# Patient Record
Sex: Female | Born: 1980 | Race: White | Hispanic: No | Marital: Married | Smoking: Former smoker
Health system: Southern US, Community
[De-identification: ages and names within clinical notes are randomized; demographics above are authoritative.]

## PROBLEM LIST (undated history)

## (undated) HISTORY — PX: LEEP: SHX91

---

## 2002-01-25 ENCOUNTER — Ambulatory Visit (HOSPITAL_COMMUNITY): Admission: RE | Admit: 2002-01-25 | Discharge: 2002-01-25 | Payer: Self-pay | Admitting: *Deleted

## 2002-02-13 ENCOUNTER — Inpatient Hospital Stay (HOSPITAL_COMMUNITY): Admission: AD | Admit: 2002-02-13 | Discharge: 2002-02-15 | Payer: Self-pay | Admitting: *Deleted

## 2002-02-19 ENCOUNTER — Inpatient Hospital Stay (HOSPITAL_COMMUNITY): Admission: AD | Admit: 2002-02-19 | Discharge: 2002-02-19 | Payer: Self-pay | Admitting: Obstetrics and Gynecology

## 2002-03-01 ENCOUNTER — Encounter: Admission: RE | Admit: 2002-03-01 | Discharge: 2002-03-01 | Payer: Self-pay | Admitting: *Deleted

## 2002-03-15 ENCOUNTER — Encounter: Admission: RE | Admit: 2002-03-15 | Discharge: 2002-03-15 | Payer: Self-pay | Admitting: *Deleted

## 2002-03-19 ENCOUNTER — Inpatient Hospital Stay (HOSPITAL_COMMUNITY): Admission: AD | Admit: 2002-03-19 | Discharge: 2002-03-19 | Payer: Self-pay | Admitting: Obstetrics and Gynecology

## 2002-03-27 ENCOUNTER — Inpatient Hospital Stay (HOSPITAL_COMMUNITY): Admission: AD | Admit: 2002-03-27 | Discharge: 2002-03-29 | Payer: Self-pay | Admitting: *Deleted

## 2002-04-05 ENCOUNTER — Encounter: Admission: RE | Admit: 2002-04-05 | Discharge: 2002-04-05 | Payer: Self-pay | Admitting: *Deleted

## 2002-04-05 ENCOUNTER — Encounter (HOSPITAL_COMMUNITY): Admission: AD | Admit: 2002-04-05 | Discharge: 2002-05-05 | Payer: Self-pay | Admitting: Obstetrics and Gynecology

## 2002-04-06 ENCOUNTER — Ambulatory Visit (HOSPITAL_COMMUNITY): Admission: RE | Admit: 2002-04-06 | Discharge: 2002-04-06 | Payer: Self-pay | Admitting: *Deleted

## 2002-04-12 ENCOUNTER — Encounter: Admission: RE | Admit: 2002-04-12 | Discharge: 2002-04-12 | Payer: Self-pay | Admitting: *Deleted

## 2002-04-19 ENCOUNTER — Encounter: Admission: RE | Admit: 2002-04-19 | Discharge: 2002-04-19 | Payer: Self-pay | Admitting: *Deleted

## 2002-04-26 ENCOUNTER — Encounter: Admission: RE | Admit: 2002-04-26 | Discharge: 2002-04-26 | Payer: Self-pay | Admitting: *Deleted

## 2002-05-03 ENCOUNTER — Encounter: Admission: RE | Admit: 2002-05-03 | Discharge: 2002-05-03 | Payer: Self-pay | Admitting: Obstetrics and Gynecology

## 2002-05-04 ENCOUNTER — Inpatient Hospital Stay (HOSPITAL_COMMUNITY): Admission: AD | Admit: 2002-05-04 | Discharge: 2002-05-04 | Payer: Self-pay | Admitting: *Deleted

## 2002-05-10 ENCOUNTER — Encounter: Admission: RE | Admit: 2002-05-10 | Discharge: 2002-05-10 | Payer: Self-pay | Admitting: *Deleted

## 2002-05-17 ENCOUNTER — Encounter: Admission: RE | Admit: 2002-05-17 | Discharge: 2002-05-17 | Payer: Self-pay | Admitting: *Deleted

## 2002-05-31 ENCOUNTER — Encounter: Admission: RE | Admit: 2002-05-31 | Discharge: 2002-05-31 | Payer: Self-pay | Admitting: *Deleted

## 2002-05-31 ENCOUNTER — Encounter (HOSPITAL_COMMUNITY): Admission: RE | Admit: 2002-05-31 | Discharge: 2002-06-30 | Payer: Self-pay | Admitting: *Deleted

## 2002-06-07 ENCOUNTER — Encounter: Admission: RE | Admit: 2002-06-07 | Discharge: 2002-06-07 | Payer: Self-pay | Admitting: *Deleted

## 2002-06-14 ENCOUNTER — Encounter: Admission: RE | Admit: 2002-06-14 | Discharge: 2002-06-14 | Payer: Self-pay | Admitting: *Deleted

## 2002-06-21 ENCOUNTER — Encounter: Admission: RE | Admit: 2002-06-21 | Discharge: 2002-06-21 | Payer: Self-pay | Admitting: *Deleted

## 2002-06-25 ENCOUNTER — Inpatient Hospital Stay (HOSPITAL_COMMUNITY): Admission: AD | Admit: 2002-06-25 | Discharge: 2002-06-25 | Payer: Self-pay | Admitting: Obstetrics and Gynecology

## 2002-06-27 ENCOUNTER — Encounter: Admission: RE | Admit: 2002-06-27 | Discharge: 2002-06-27 | Payer: Self-pay | Admitting: *Deleted

## 2002-06-30 ENCOUNTER — Inpatient Hospital Stay (HOSPITAL_COMMUNITY): Admission: AD | Admit: 2002-06-30 | Discharge: 2002-06-30 | Payer: Self-pay | Admitting: *Deleted

## 2002-07-01 ENCOUNTER — Inpatient Hospital Stay (HOSPITAL_COMMUNITY): Admission: AD | Admit: 2002-07-01 | Discharge: 2002-07-03 | Payer: Self-pay | Admitting: *Deleted

## 2004-12-29 ENCOUNTER — Ambulatory Visit: Payer: Self-pay | Admitting: *Deleted

## 2005-01-04 ENCOUNTER — Ambulatory Visit: Payer: Self-pay | Admitting: *Deleted

## 2005-01-04 ENCOUNTER — Inpatient Hospital Stay (HOSPITAL_COMMUNITY): Admission: AD | Admit: 2005-01-04 | Discharge: 2005-01-06 | Payer: Self-pay | Admitting: *Deleted

## 2005-01-13 ENCOUNTER — Ambulatory Visit: Payer: Self-pay | Admitting: *Deleted

## 2005-02-03 ENCOUNTER — Ambulatory Visit: Payer: Self-pay | Admitting: Family Medicine

## 2005-02-17 ENCOUNTER — Ambulatory Visit: Payer: Self-pay | Admitting: *Deleted

## 2005-02-17 ENCOUNTER — Ambulatory Visit (HOSPITAL_COMMUNITY): Admission: RE | Admit: 2005-02-17 | Discharge: 2005-02-17 | Payer: Self-pay | Admitting: Unknown Physician Specialty

## 2005-03-03 ENCOUNTER — Ambulatory Visit: Payer: Self-pay | Admitting: Family Medicine

## 2005-03-24 ENCOUNTER — Ambulatory Visit: Payer: Self-pay | Admitting: Obstetrics & Gynecology

## 2005-03-31 ENCOUNTER — Ambulatory Visit: Payer: Self-pay | Admitting: Obstetrics & Gynecology

## 2005-04-01 ENCOUNTER — Inpatient Hospital Stay (HOSPITAL_COMMUNITY): Admission: AD | Admit: 2005-04-01 | Discharge: 2005-04-01 | Payer: Self-pay | Admitting: *Deleted

## 2005-04-07 ENCOUNTER — Ambulatory Visit: Payer: Self-pay | Admitting: *Deleted

## 2005-04-14 ENCOUNTER — Ambulatory Visit: Payer: Self-pay | Admitting: *Deleted

## 2005-04-28 ENCOUNTER — Ambulatory Visit: Payer: Self-pay | Admitting: Obstetrics & Gynecology

## 2005-05-05 ENCOUNTER — Ambulatory Visit: Payer: Self-pay | Admitting: *Deleted

## 2005-05-19 ENCOUNTER — Ambulatory Visit: Payer: Self-pay | Admitting: *Deleted

## 2005-05-26 ENCOUNTER — Ambulatory Visit: Payer: Self-pay | Admitting: *Deleted

## 2005-06-03 ENCOUNTER — Ambulatory Visit: Payer: Self-pay | Admitting: Family Medicine

## 2005-06-10 ENCOUNTER — Ambulatory Visit: Payer: Self-pay | Admitting: *Deleted

## 2005-06-10 ENCOUNTER — Inpatient Hospital Stay (HOSPITAL_COMMUNITY): Admission: AD | Admit: 2005-06-10 | Discharge: 2005-06-10 | Payer: Self-pay | Admitting: Family Medicine

## 2005-06-10 ENCOUNTER — Ambulatory Visit: Payer: Self-pay | Admitting: Family Medicine

## 2005-06-17 ENCOUNTER — Ambulatory Visit: Payer: Self-pay | Admitting: Family Medicine

## 2005-06-24 ENCOUNTER — Ambulatory Visit: Payer: Self-pay | Admitting: Family Medicine

## 2005-07-01 ENCOUNTER — Ambulatory Visit: Payer: Self-pay | Admitting: Family Medicine

## 2005-07-08 ENCOUNTER — Ambulatory Visit: Payer: Self-pay | Admitting: *Deleted

## 2005-07-08 ENCOUNTER — Inpatient Hospital Stay (HOSPITAL_COMMUNITY): Admission: AD | Admit: 2005-07-08 | Discharge: 2005-07-11 | Payer: Self-pay | Admitting: Obstetrics & Gynecology

## 2005-07-08 ENCOUNTER — Ambulatory Visit: Payer: Self-pay | Admitting: Obstetrics & Gynecology

## 2008-12-06 ENCOUNTER — Encounter: Admission: RE | Admit: 2008-12-06 | Discharge: 2008-12-06 | Payer: Self-pay | Admitting: Internal Medicine

## 2010-02-03 IMAGING — US US TRANSVAGINAL NON-OB
1 series · 14 of 25 positions shown · non-contrast
Comparison: None

CLINICAL DATA: Fibroids.

TRANSABDOMINAL AND TRANSVAGINAL ULTRASOUND OF PELVIS
TECHNIQUE: Both transabdominal and transvaginal ultrasound
examinations of the pelvis were performed including evaluation of
the uterus, ovaries, adnexal regions, and pelvic cul-de-sac.

[Series 1: us transvaginal non-ob · 0.33mm/px · 14 of 95 slices shown]
[im 1/95]
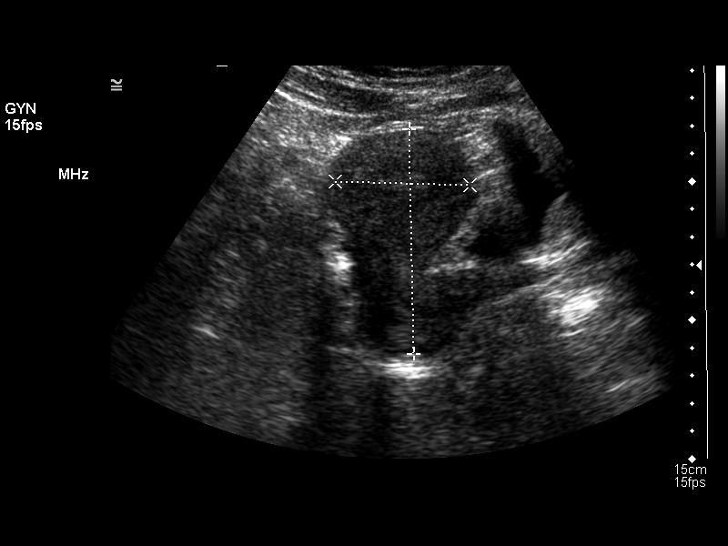
[im 8/95]
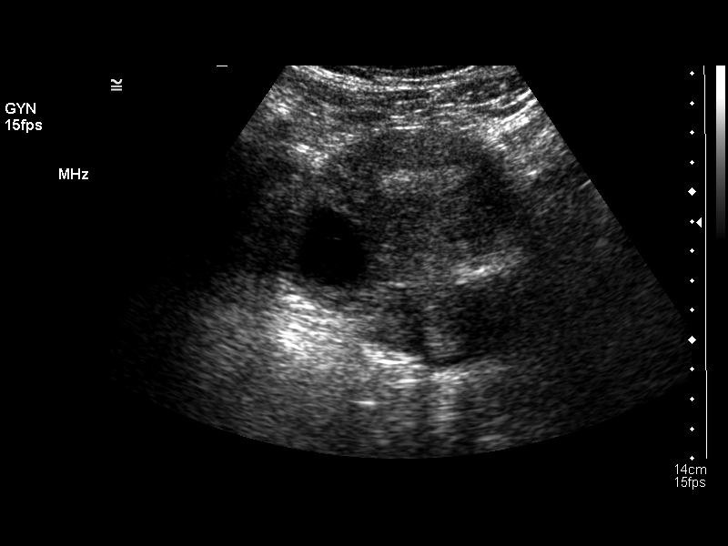
[im 16/95]
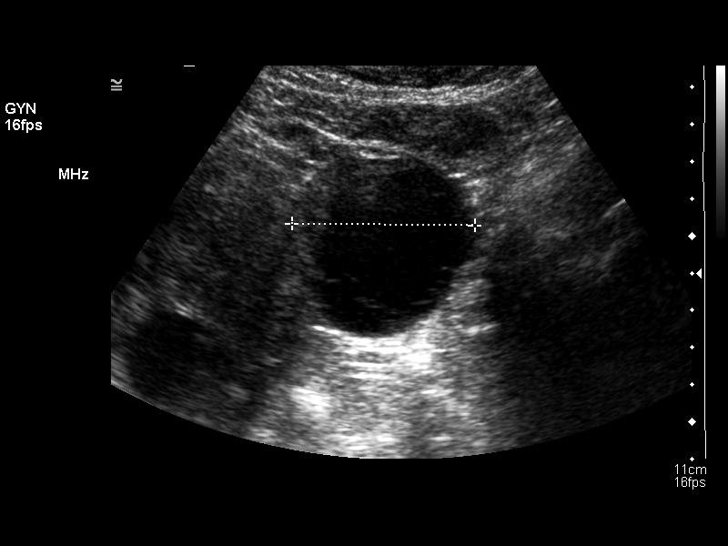
[im 24/95]
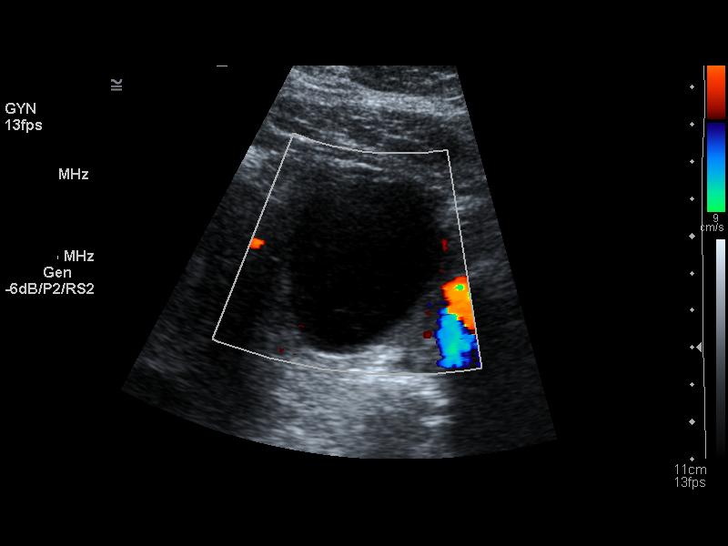
[im 32/95]
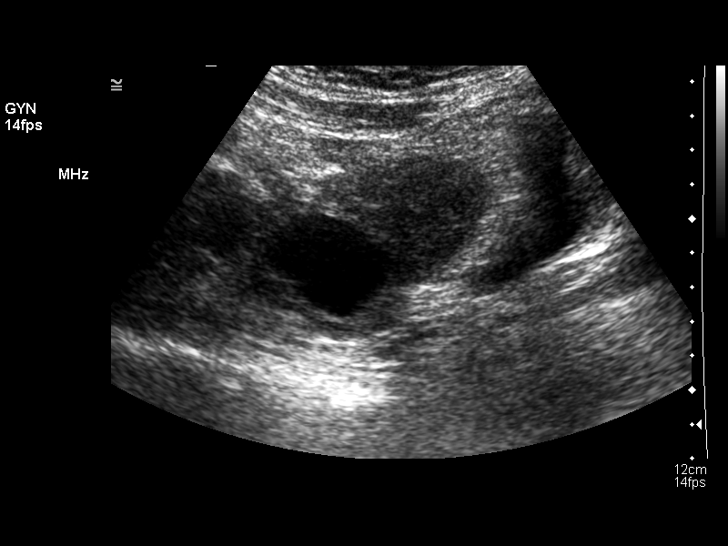
[im 36/95]
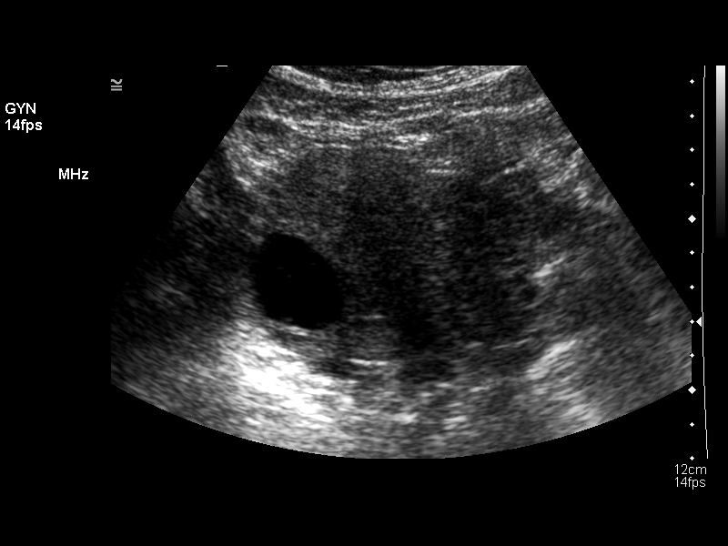
[im 44/95]
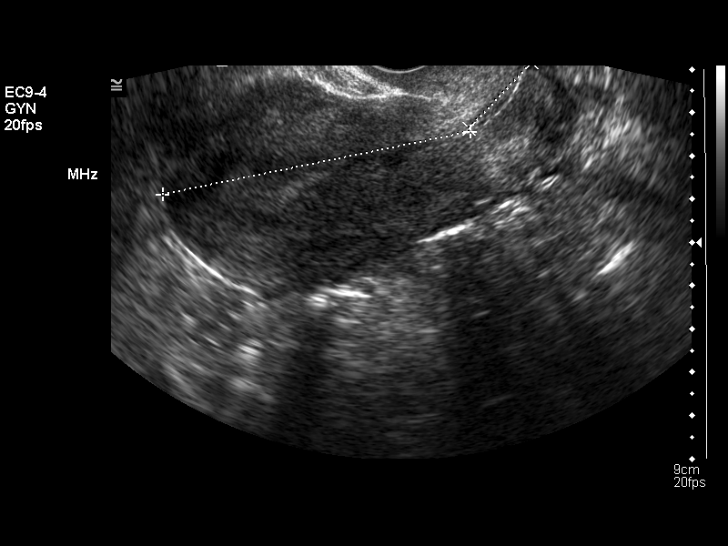
[im 51/95]
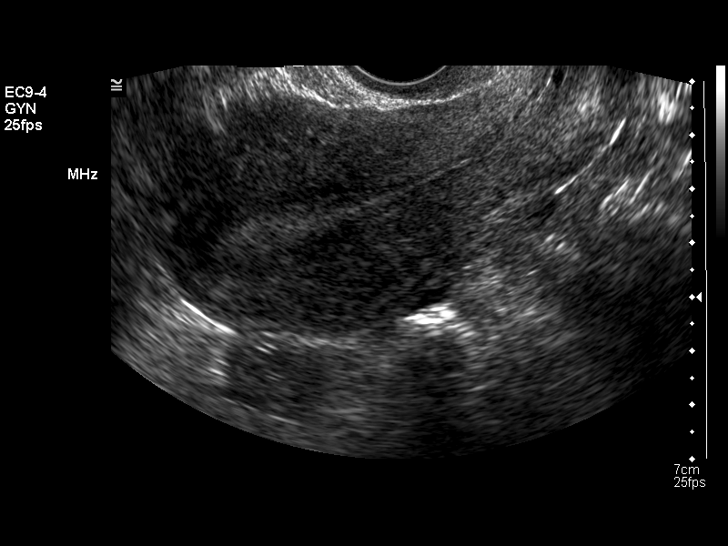
[im 59/95]
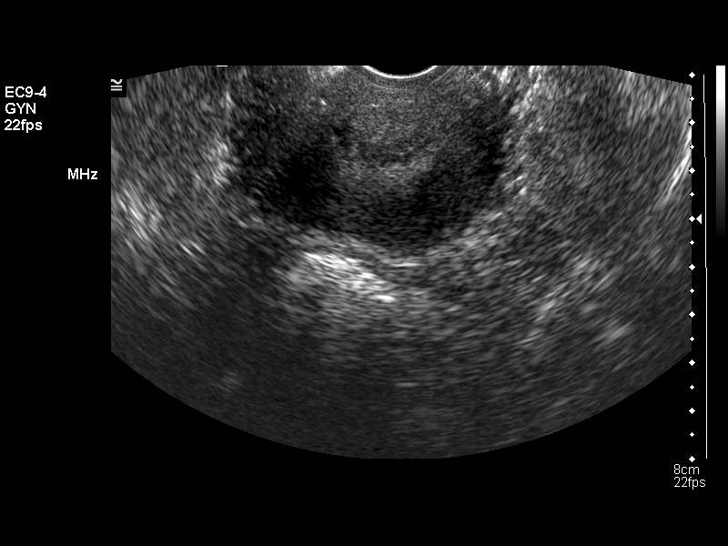
[im 63/95]
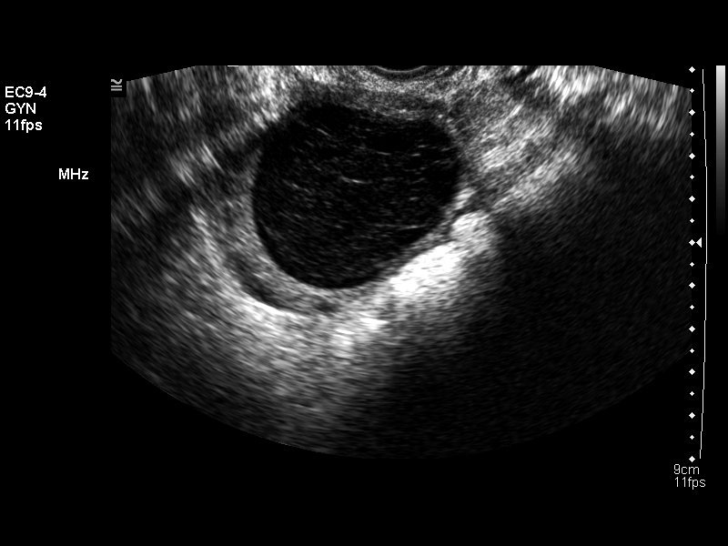
[im 71/95]
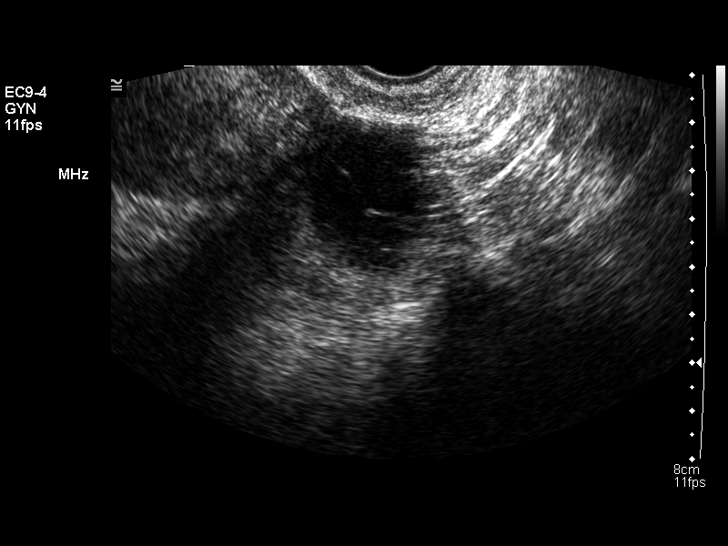
[im 79/95]
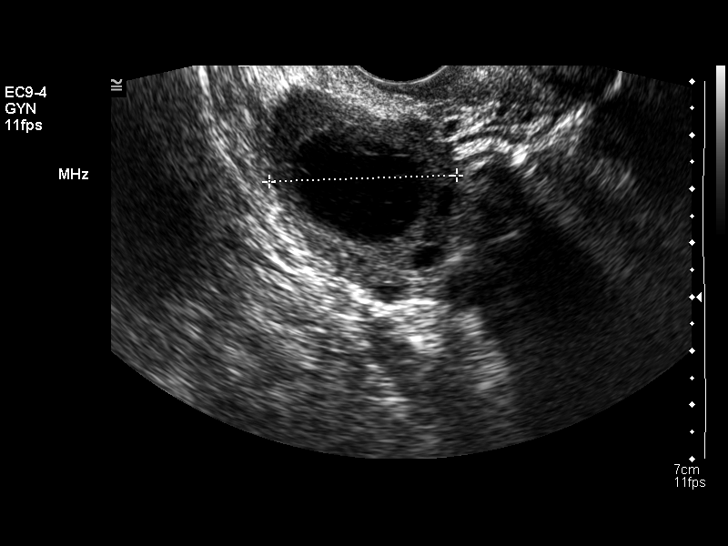
[im 87/95]
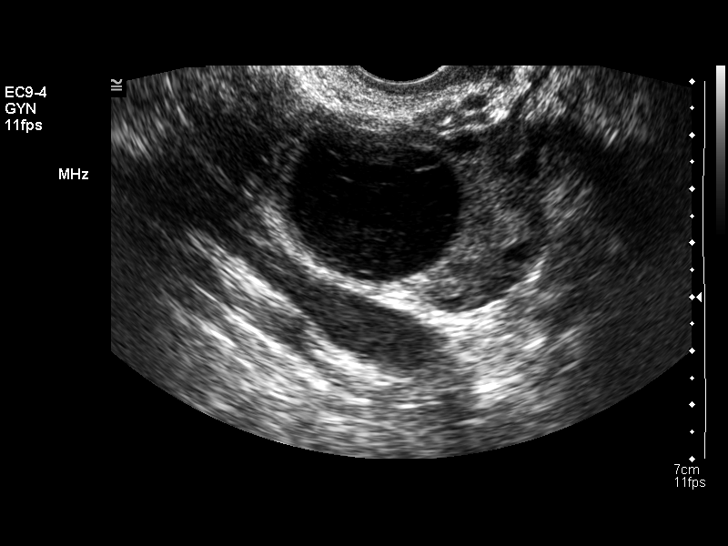
[im 95/95]
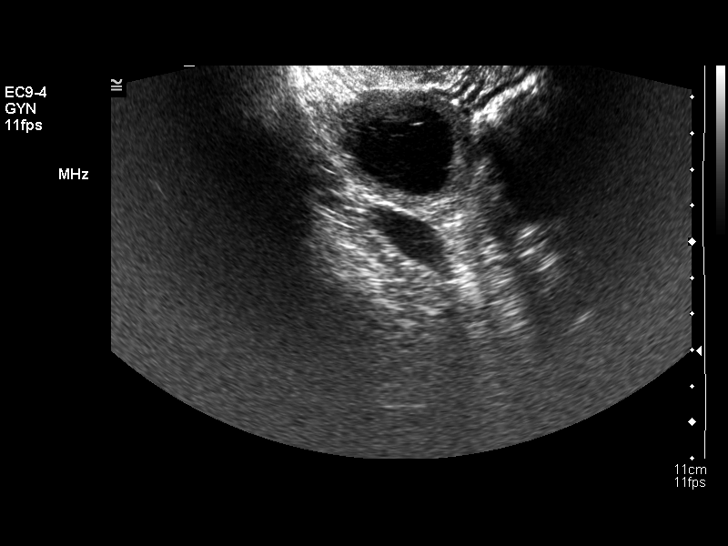

[14 of 25 positions shown; findings below may reference images not displayed]

FINDINGS: The uterus measures 5.4 cm in length and the fundus
measures 4.7 x 5.7 cm in transverse dimensions.  Endometrial
complex measures 9.5 mm in thickness.  The right ovary measures
x 3.4 x 3.5 cm.  The left ovary measures 6.0 x 4.5 x 5.7 cm.
Complex cyst in the right ovary measuring 3.2 x 2.8 x 3.0 cm.
There is a large complex cyst in the left ovary measuring 5.1 x
x 4.6 cm.  Both ovarian cyst and internal echoes, the left with a
lace-like echotexture pattern.  I believe that the patient has
hemorrhagic cysts.
IMPRESSION: Normal uterus.  No uterine fibroids.  Bilateral complex ovarian
cysts, compatible with hemorrhagic cysts.

## 2010-11-08 ENCOUNTER — Encounter: Payer: Self-pay | Admitting: Unknown Physician Specialty

## 2011-03-05 NOTE — Discharge Summary (Signed)
[facility]  Patient:    Toni Ortiz, Toni Ortiz Visit Number: [id number] MRN: [id number]          Service Type: OBS Location: 910D 9122 01 Attending Physician:  Michaelle Copas Dictated by:   Ed Blalock. Burnadette Peter, M.D. Admit Date:  03/27/2002 Discharge Date: 03/29/2002                             Discharge Summary  DATE OF BIRTH:                   03-13-1981  HISTORY OF PRESENT ILLNESS:      This 30 year old G2, P0-0-1-0, presented at 26-6/7 weeks based on a last menstrual period confirmed by 18-week ultrasound with a chief complaint of contractions and back pain.  The patient reported contractions since early the morning of admission and denied bleeding or leakage of fluid and reported positive fetal movement.  She reports lower abdominal pain and cramping back pain that had increased in frequency since the morning of admission, it started out every 15 minutes then every 5 minutes, it was worsened with activity.  She is seen at high risk clinic with onset of care at 23-plus weeks.  Prior to that she was seen at [facility] and had a rescue cerclage placed at 18 weeks.  MEDICATIONS:                     Prenatal vitamins.  ALLERGIES:                       No known drug allergies.  OBSTETRICAL HISTORY:             None.  GYNECOLOGICAL HISTORY:           She had a 9-week TAB in 2001 and a LEEP 2002.  MEDICAL HISTORY:                 History of UTIs.  SURGICAL HISTORY:                As above otherwise none.  FAMILY AND SOCIAL HISTORY:       Denied tobacco, illicit drug use.  OBJECTIVE:                       On presentation her vital signs were stable, she was afebrile, generally in no acute distress, heart rate regular rate and rhythm, lungs clear to auscultation, abdomen soft and nontender.  Digital cervical exam showed fingertip rescue cerclage intact.  Fetal heart rate was normal with good variability and reactive.  Cath UA on  presentation was negative.  The wet prep showed moderate clue cells with moderate bacteria.  HOSPITAL COURSE:                 This patient with a 26-6/7-weeks intrauterine pregnancy and cervicitis due to bacterial vaginosis was admitted and was started on Unasyn and Procardia.  On hospital day #1 she reported no contractions and no further cramping or any difficulty.  Cervical exam remained stable.  Hospital day #2 the patient again reported no abdominal pain, back pain, cramping and had a cervical exam as fingertip 2 to 2.5 cm, cerclage intact with no pressure on stitch and developed lower uterine segment which was unchanged from the day prior by my exam.  The patient also reported that she was supposed  to get a glucola and ultrasound for cervical length at 28 weeks.  On admission she had cultures done, GC and Chlamydia were negative, GBS was negative.  The patient deemed stable with negative cultures and was therefore discharged home with instructions for followup at high risk clinic.  DISCHARGE MEDICATION:            Flagyl 500 mg one p.o. b.i.d. x5 days.  DISCHARGE ACTIVITY:              Bed rest and pelvic rest.  DISCHARGE DIET:                  No restrictions.  DISCHARGE FOLLOWUP:              She is to be seen at high risk clinic on April 05, 2002 at 9 oclock.  At that time she needs a glucola screen and an ultrasound for cervical length.  DISCHARGE CONDITION:             Improved.  DISCHARGE DIAGNOSES:             1. Intrauterine pregnancy at 27-1/7 weeks.                                  2. Preterm contractions.                                  3. History of preterm labor with rescue                                     cerclage.                                  4. Cervicitis due to bacterial vaginosis. Dictated by:   Ed Blalock. Burnadette Peter, M.D. Attending Physician:  Michaelle Copas DD:  03/29/02 TD:  03/31/02 Job: [id number]

## 2011-03-05 NOTE — Op Note (Signed)
[facility]  Patient:    Toni Ortiz, Toni Ortiz Visit Number: [id number] MRN: [id number]          Service Type: OBS Location: 910D 9125 01 Attending Physician:  Michaelle Copas Dictated by:   Mary Sella. Orlene Erm, M.D. Proc. Date: 02/14/02 Admit Date:  02/13/2002   CC:         Elinor Dodge, M.D.   Operative Report  PREOPERATIVE DIAGNOSIS:       A 30 year old G2, P1 at [redacted] weeks gestation with shortened cervix and cervical dilatation consistent with incompetent cervix.  POSTOPERATIVE DIAGNOSIS:      A 30 year old G2, P1 at [redacted] weeks gestation with shortened cervix and cervical dilatation consistent with incompetent cervix.  PROCEDURE:                    Shirodkar cerclage.  SURGEON:                      Mary Sella. Orlene Erm, M.D.  ASSISTANT:                    Elinor Dodge, M.D.  ANESTHESIA:                   Spinal.  COMPLICATIONS:                None.  SPECIMENS:                    None.  ESTIMATED BLOOD LOSS:         Minimal.  DISPOSITION:                  To the recovery room stable.  DESCRIPTION OF PROCEDURE:     The patient was taken to the operating room, where she was given spinal anesthesia.  She was placed in the [place] stirrups, prepped and draped in sterile fashion.  A weighted speculum was placed in the vagina and the anterior cervix was grasped with a cervical clamp.  An incision was made with a scalpel over the cervical mucosa.  A bladder flap was created and pushed back with a moistened sponge and the surgeons finger.  A Mersilene band was then introduced at 12 oclock to 9 oclock and then out through the cervical mucosa.  A second throw was placed from 9 oclock to 6 oclock.  The Mersilene band was directed from 12 oclock to 3 oclock and again from 3 oclock back to 6 oclock.  The needles were cut and the suture was tied on the posterior cervix at approximately 6 oclock.  The cervical exam prior to the placement of the cerclage revealed an  approximate 0.5 cm length of cervix with fingertip dilatation.  Postoperatively, there was about 1.5 to 2 cm length of cervix with the cervix closed.  The vagina was irrigated with Cleocin solution at the end of the procedure.  The cervical mucosal incision was repaired with a running locking stitch of 3-0 chromic.  The vagina was irrigated with Cleocin solution.  The cervix was hemostatic.  The instruments were removed from the vagina and the patient was taken to the recovery room in stable condition. Dictated by:   Mary Sella. Orlene Erm, M.D. Attending Physician:  Michaelle Copas DD:  02/14/02 TD:  02/14/02 Job: [id number]

## 2011-03-05 NOTE — Op Note (Signed)
[name], Toni Ortiz              ACCOUNT NO.:  [id number]   MEDICAL RECORD NO.:  [id number]          PATIENT TYPE:  INP   LOCATION:  9309                          FACILITY:  [facility]   PHYSICIAN:  Conni Elliot, M.D.DATE OF BIRTH:  06-Sep-1981   DATE OF PROCEDURE:  01/04/2005  DATE OF DISCHARGE:                                 OPERATIVE REPORT   PREOPERATIVE DIAGNOSIS:  Decreased cervical resistance.   POSTOPERATIVE DIAGNOSIS:  Decreased cervical resistance.   PROCEDURE:  McDonald cerclage.   ANESTHESIA:  Spinal.   SURGEON:  Conni Elliot, M.D.   DESCRIPTION OF PROCEDURE:  After the patient was placed under spinal  anesthetic, the patient was supine in the dorsal lithotomy position.  The  perineum and vagina were prepped with Betadine solution.  Foley catheter was  placed to straight drainage.  Antibiotic douche was placed per vagina.  A  cervical cerclage was placed starting at 12 o'clock in a counter clockwise  fashion using #4 double-stranded silk.  It was tied at 12 o'clock.  Estimated blood loss was minimal.  Needle, sponge, and instrument counts  correct.      ASG/MEDQ  D:  01/05/2005  T:  01/05/2005  Job:  [id number]

## 2011-03-05 NOTE — Discharge Summary (Signed)
[facility]  Patient:    Toni Ortiz, Toni Ortiz Visit Number: [id number] MRN: [id number]          Service Type: OBS Location: 910D 9125 01 Attending Physician:  Michaelle Copas Dictated by:   Vear Clock, M.D. Admit Date:  02/13/2002 Discharge Date: 02/15/2002                             Discharge Summary  DISCHARGE DIAGNOSIS:          Preterm cervical dilation with cerclage placement postoperative day #1.  CURRENT MEDICATIONS:          None.  FOLLOWUP:                     The patient is to follow up at high-risk clinic on Thursday, Feb 22, 2002; they will call the patient with appointment.  PROCEDURES AND DIAGNOSTIC STUDIES: 1. Ultrasound on January 25, 2002 shows the cervix 4.1 cm, 3.6-cm fluid pocket. 2. The patient did have a Shirodkar cerclage placed on February 14, 2002 by    Dr. Shearon Balo and Dr. Okey Dupre.  ADMISSION HISTORY AND PHYSICAL:  In short, this is a 30 year old G2, P0-0-1-0 at 21-0/7 who presented with cramping x2 weeks which became unbearable, went to the [facility] for further evaluation and was sent to [facility].  Denies UTI symptoms.  The patient was found to have early cervical dilation consistent with an incompetent cervix.  HOSPITAL COURSE:              Based on preterm contractions and early cervical dilation, the patient was taken for cerclage on February 14, 2002, started on Unasyn and Motrin and kept on bedrest throughout hospitalization.  The patient tolerated the Shirodkar cerclage well and had estimated blood loss of less than 10 cc, no complications and did well in the postop period and was ready for discharge on postop day 1.  DISCHARGE LABORATORY AND ACCESSORY DATA:  CBC on February 13, 2002 shows WBC 13.4, hemoglobin 11.3, platelets 318,000.  UA was within normal limits.  The patient is O-positive, antibody-negative, group B strep negative.  Wet prep shows a few wbcs.  GC and  Chlamydia negative.  DISPOSITION:                  The patient was discharged to home without further incident on postop day #1 after her cerclage. Dictated by:   Vear Clock, M.D. Attending Physician:  Michaelle Copas DD:  02/15/02 TD:  02/18/02 Job: [id number]

## 2011-07-18 ENCOUNTER — Encounter: Payer: Self-pay | Admitting: Family Medicine

## 2011-07-18 ENCOUNTER — Inpatient Hospital Stay (INDEPENDENT_AMBULATORY_CARE_PROVIDER_SITE_OTHER)
Admission: RE | Admit: 2011-07-18 | Discharge: 2011-07-18 | Disposition: A | Discharge status: Home / Self Care | Payer: BC Managed Care – PPO | Source: Ambulatory Visit | Attending: Family Medicine | Admitting: Family Medicine

## 2011-07-18 DIAGNOSIS — J45909 Unspecified asthma, uncomplicated: Secondary | ICD-10-CM | POA: Insufficient documentation

## 2011-07-18 DIAGNOSIS — J309 Allergic rhinitis, unspecified: Secondary | ICD-10-CM

## 2011-07-18 DIAGNOSIS — J069 Acute upper respiratory infection, unspecified: Secondary | ICD-10-CM

## 2011-09-20 NOTE — Progress Notes (Signed)
Summary: ? SINUS INFX (room 5)   Vital Signs:  Patient Profile:   30 Years Old Female CC:      possible sinus infx x 1 week Height:     66 inches Weight:      206 pounds O2 Sat:      98 % O2 treatment:    Room Air Temp:     98.7 degrees F oral Pulse rate:   67 / minute Resp:     16 per minute BP sitting:   107 / 72  (left arm) Cuff size:   regular  Pt. in pain?   yes    Location:   head  Vitals Entered By: Lavell Islam RN (July 18, 2011 2:08 PM)                   Updated Prior Medication List: * ZYRTEC daily * MUCINEX prn * ALBUTEROL INHALER? as needed  Current Allergies: ! * ENVIRONMENTALHistory of Present Illness Chief Complaint: possible sinus infx x 1 week History of Present Illness:  Subjective: Patient complains of developing a left headache and left sinus congestion about one weeks ago.  She developed a cough yesterday.  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  She takes Zyrtec and uses a steroid nasal inhaler No sore throat No pleuritic pain + wheezing + post-nasal drainage + sinus pain/pressure No itchy/red eyes No earache No hemoptysis No SOB No fever/chills No nausea No vomiting No abdominal pain No diarrhea No skin rashes + fatigue No myalgias + headache Used OTC meds without relief   REVIEW OF SYSTEMS Constitutional Symptoms       Complains of fever.     Denies chills, night sweats, weight loss, weight gain, and fatigue.  Eyes       Denies change in vision, eye pain, eye discharge, glasses, contact lenses, and eye surgery. Ear/Nose/Throat/Mouth       Complains of sinus problems and hoarseness.      Denies hearing loss/aids, change in hearing, ear pain, ear discharge, dizziness, frequent runny nose, frequent nose bleeds, sore throat, and tooth pain or bleeding.  Respiratory       Complains of dry cough, wheezing, shortness of breath, and asthma.      Denies productive cough, bronchitis, and emphysema/COPD.    Cardiovascular       Complains of tires easily with exhertion.      Denies murmurs and chest pain.    Gastrointestinal       Denies stomach pain, nausea/vomiting, diarrhea, constipation, blood in bowel movements, and indigestion. Genitourniary       Denies painful urination, kidney stones, and loss of urinary control. Neurological       Denies paralysis, seizures, and fainting/blackouts. Musculoskeletal       Denies muscle pain, joint pain, joint stiffness, decreased range of motion, redness, swelling, muscle weakness, and gout.  Skin       Denies bruising, unusual mles/lumps or sores, and hair/skin or nail changes.  Psych       Denies mood changes, temper/anger issues, anxiety/stress, speech problems, depression, and sleep problems. Other Comments: possible sinus infx x 1 week   Past History:  Past Medical History: sinus infxs Asthma  Past Surgical History: Denies surgical history  Family History: Reviewed history and no changes required. unremarkable  Social History: Never Smoked Alcohol use-no Drug use-no Smoking Status:  never Drug Use:  no   Objective:  No acute distress  Eyes:  Pupils  are equal, round, and reactive to light and accomodation.  Extraocular movement is intact.  Conjunctivae are not inflamed.  Ears:  Canals normal.  Tympanic membranes normal.   Nose:  Mildly congested turbinates.  No sinus tenderness  Pharynx:  Normal  Neck:  Supple.  No adenopathy is present.   Lungs:  Clear to auscultation.  Breath sounds are equal. Heart:  Regular rate and rhythm without murmurs, rubs, or gallops.  Abdomen:  Nontender without masses or hepatosplenomegaly.  Bowel sounds are present.  No CVA or flank tenderness.  Extremities:  No edema.   Assessment New Problems: UPPER RESPIRATORY INFECTION, ACUTE (ICD-465.9) ALLERGIC RHINITIS (ICD-477.9) ASTHMA (ICD-493.90)   Plan New Medications/Changes: DIFLUCAN 150 MG TABS (FLUCONAZOLE) One by mouth as a single dose   #1 x 0, 07/18/2011, Donna Christen MD AMOXICILLIN 875 MG TABS (AMOXICILLIN) One by mouth two times a day (Rx void after 07/27/11)  #20 x 0, 07/18/2011, Donna Christen MD  New Orders: Pulse Oximetry (single measurment) [phone] New Patient Level III [99203] Services provided After hours-Weekends-Holidays [99051] Planning Comments:   Treat symptomatically for now:  Increase fluid intake, begin expectorant/decongestant, topical decongestant,  cough suppressant at bedtime. Continue steroid nasal inhaler.  If fever/chills/sweats persist, or if not improving 5  days begin amoxicillin (given Rx to hold).  Followup with PCP if not improving 7 to 10 days.   The patient and/or caregiver has been counseled thoroughly with regard to medications prescribed including dosage, schedule, interactions, rationale for use, and possible side effects and they verbalize understanding.  Diagnoses and expected course of recovery discussed and will return if not improved as expected or if the condition worsens. Patient and/or caregiver verbalized understanding.  Prescriptions: DIFLUCAN 150 MG TABS (FLUCONAZOLE) One by mouth as a single dose  #1 x 0   Entered and Authorized by:   Donna Christen MD   Signed by:   Donna Christen MD on 07/18/2011   Method used:   Print then Give to Patient   RxID:   [id number] AMOXICILLIN 875 MG TABS (AMOXICILLIN) One by mouth two times a day (Rx void after 07/27/11)  #20 x 0   Entered and Authorized by:   Donna Christen MD   Signed by:   Donna Christen MD on 07/18/2011   Method used:   Print then Give to Patient   RxID:   [id number]   Patient Instructions: 1)  Take Mucinex D (guaifenesin with decongestant) twice daily for congestion. 2)  Increase fluid intake, rest. 3)  Stop Zyrtec for now 4)  May use Afrin nasal spray (or generic oxymetazoline) twice daily for about 5 days.  Also recommend using saline nasal spray several times daily and/or saline nasal irrigation.  Use your  steroid inhaler after using Afrin spray and saline irrigation 5)  Begin amoxicillin if not improving about 5 days or if persistent fever develops. 6)  Followup with ENT doctor if not improving 7 to 10 days.   Orders Added: 1)  Pulse Oximetry (single measurment) [94760] 2)  New Patient Level III [29528] 3)  Services provided After hours-Weekends-Holidays [99051]

## 2014-04-10 ENCOUNTER — Encounter: Payer: Self-pay | Admitting: Emergency Medicine

## 2014-04-10 ENCOUNTER — Emergency Department
Admission: EM | Admit: 2014-04-10 | Discharge: 2014-04-10 | Disposition: A | Discharge status: Home / Self Care | Payer: BC Managed Care – PPO | Source: Home / Self Care | Attending: Family Medicine | Admitting: Family Medicine

## 2014-04-10 DIAGNOSIS — R300 Dysuria: Secondary | ICD-10-CM

## 2014-04-10 LAB — POCT URINALYSIS DIP (MANUAL ENTRY)
BILIRUBIN UA: NEGATIVE
Bilirubin, UA: NEGATIVE
Glucose, UA: NEGATIVE
Leukocytes, UA: NEGATIVE
Nitrite, UA: NEGATIVE
PH UA: 7 (ref 5–8)
PROTEIN UA: NEGATIVE
RBC UA: NEGATIVE
SPEC GRAV UA: 1.015 (ref 1.005–1.03)
Urobilinogen, UA: 0.2 (ref 0–1)

## 2014-04-10 LAB — POCT URINE PREGNANCY: PREG TEST UR: NEGATIVE

## 2014-04-10 MED ORDER — NITROFURANTOIN MONOHYD MACRO 100 MG PO CAPS: 100.0000 mg | ORAL_CAPSULE | Freq: Two times a day (BID) | ORAL | Status: DC

## 2014-04-10 NOTE — ED Notes (Signed)
Pt c/o intermittent lower abd pressure and pain x last night. Denies fever or dysuria.

## 2014-04-10 NOTE — ED Provider Notes (Signed)
CSN: [id number]     Arrival date & time 04/10/14  1546 History   First MD Initiated Contact with Patient 04/10/14 [id number]     Chief Complaint  Patient presents with  . Dysuria      HPI Comments: Patient developed lower abdominal pressure and urgency last night, and decreased urination.  No fevers, chills, and sweats.  No nausea/vomiting.  No vaginal discharge.  Patient's last menstrual period was 03/22/2014 and heavier than usual.  Patient is a 33 y.o. female presenting with frequency. The history is provided by the patient.  Urinary Frequency This is a new problem. The current episode started yesterday. The problem occurs constantly. The problem has not changed since onset.Associated symptoms include abdominal pain. Nothing aggravates the symptoms. Nothing relieves the symptoms. She has tried nothing for the symptoms.    History reviewed. No pertinent past medical history. Past Surgical History  Procedure Laterality Date  . Leep     History reviewed. No pertinent family history. History  Substance Use Topics  . Smoking status: Never Smoker   . Smokeless tobacco: Not on file  . Alcohol Use: Yes   OB History   Grav Para Term Preterm Abortions TAB SAB Ect Mult Living                 Review of Systems  Gastrointestinal: Positive for abdominal pain.  Genitourinary: Positive for frequency.  All other systems reviewed and are negative.   Allergies  Review of patient's allergies indicates no known allergies.  Home Medications   Prior to Admission medications   Medication Sig Start Date End Date Taking? Authorizing Provider  nitrofurantoin, macrocrystal-monohydrate, (MACROBID) 100 MG capsule Take 1 capsule (100 mg total) by mouth 2 (two) times daily. Take with food. 04/10/14   Lattie HawStephen A Beese, MD   BP 110/71  Pulse 74  Temp(Src) 98.4 F (36.9 C) (Oral)  Resp 18  Ht 5\' 6"  (1.676 m)  Wt 226 lb (102.513 kg)  BMI 36.49 kg/m2  SpO2 99%  LMP 03/22/2014 Physical Exam Nursing  notes and Vital Signs reviewed. Appearance:  Patient appears stated age, and in no acute distress.  Patient is obese (BMI 36.5) Eyes:  Pupils are equal, round, and reactive to light and accomodation.  Extraocular movement is intact.  Conjunctivae are not inflamed  Pharynx:  Normal Neck:  Supple.  No adenopathy Lungs:  Clear to auscultation.  Breath sounds are equal.  Heart:  Regular rate and rhythm without murmurs, rubs, or gallops.  Abdomen:  Mild suprapubic tenderness without masses or hepatosplenomegaly.  Bowel sounds are present.  No CVA or flank tenderness.  Extremities:  No edema.  No calf tenderness Skin:  No rash present.   ED Course  Procedures  none    Labs Reviewed  URINE CULTURE  POCT URINALYSIS DIP (MANUAL ENTRY) negative  POCT URINE PREGNANCY negative         MDM   1. Dysuria    Urine culture pending.  Begin Macrobid. Increase fluid intake.  May use non-prescription AZO for about two days, if desired, to decrease urinary discomfort.  If symptoms become significantly worse during the night or over the weekend, proceed to the local emergency room. Followup with Family Doctor if not improved in one week.     Lattie HawStephen A Beese, MD 04/10/14 [id number]

## 2014-04-10 NOTE — Discharge Instructions (Signed)
Increase fluid intake.  May use non-prescription AZO for about two days, if desired, to decrease urinary discomfort. If symptoms become significantly worse during the night or over the weekend, proceed to the local emergency room.  ° ° °Dysuria °Dysuria is the medical term for pain with urination. There are many causes for dysuria, but urinary tract infection is the most common. If a urinalysis was performed it can show that there is a urinary tract infection. A urine culture confirms that you or your child is sick. You will need to follow up with a healthcare provider because: °· If a urine culture was done you will need to know the culture results and treatment recommendations. °· If the urine culture was positive, you or your child will need to be put on antibiotics or know if the antibiotics prescribed are the right antibiotics for your urinary tract infection. °· If the urine culture is negative (no urinary tract infection), then other causes may need to be explored or antibiotics need to be stopped. °Today laboratory work may have been done and there does not seem to be an infection. If cultures were done they will take at least 24 to 48 hours to be completed. °Today x-rays may have been taken and they read as normal. No cause can be found for the problems. The x-rays may be re-read by a radiologist and you will be contacted if additional findings are made. °You or your child may have been put on medications to help with this problem until you can see your primary caregiver. If the problems get better, see your primary caregiver if the problems return. If you were given antibiotics (medications which kill germs), take all of the mediations as directed for the full course of treatment.  °If laboratory work was done, you need to find the results. Leave a telephone number where you can be reached. If this is not possible, make sure you find out how you are to get test results. °HOME CARE INSTRUCTIONS  °· Drink  lots of fluids. For adults, drink eight, 8 ounce glasses of clear juice or water a day. For children, replace fluids as suggested by your caregiver. °· Empty the bladder often. Avoid holding urine for long periods of time. °· After a bowel movement, women should cleanse front to back, using each tissue only once. °· Empty your bladder before and after sexual intercourse. °· Take all the medicine given to you until it is gone. You may feel better in a few days, but TAKE ALL MEDICINE. °· Avoid caffeine, tea, alcohol and carbonated beverages, because they tend to irritate the bladder. °· In men, alcohol may irritate the prostate. °· Only take over-the-counter or prescription medicines for pain, discomfort, or fever as directed by your caregiver. °· If your caregiver has given you a follow-up appointment, it is very important to keep that appointment. Not keeping the appointment could result in a chronic or permanent injury, pain, and disability. If there is any problem keeping the appointment, you must call back to this facility for assistance. °SEEK IMMEDIATE MEDICAL CARE IF:  °· Back pain develops. °· A fever develops. °· There is nausea (feeling sick to your stomach) or vomiting (throwing up). °· Problems are no better with medications or are getting worse. °MAKE SURE YOU:  °· Understand these instructions. °· Will watch your condition. °· Will get help right away if you are not doing well or get worse. °Document Released: 07/02/2004 Document Revised: 12/27/2011 Document Reviewed: 05/09/2008 °  [employer]® Patient Information ©2015 [employer]. This information is not intended to replace advice given to you by your health care provider. Make sure you discuss any questions you have with your health care provider. ° °

## 2014-04-12 LAB — URINE CULTURE: Colony Count: >=100000

## 2014-04-15 ENCOUNTER — Telehealth: Payer: Self-pay

## 2014-04-15 NOTE — ED Notes (Signed)
Left a message on voice mail asking how patient is feeling and advising to call back with any questions or concerns.  

## 2014-10-26 ENCOUNTER — Emergency Department
Admission: EM | Admit: 2014-10-26 | Discharge: 2014-10-26 | Disposition: A | Discharge status: Home / Self Care | Payer: BLUE CROSS/BLUE SHIELD | Source: Home / Self Care | Attending: Emergency Medicine | Admitting: Emergency Medicine

## 2014-10-26 ENCOUNTER — Encounter: Payer: Self-pay | Admitting: Emergency Medicine

## 2014-10-26 DIAGNOSIS — J0120 Acute ethmoidal sinusitis, unspecified: Secondary | ICD-10-CM

## 2014-10-26 MED ORDER — AMOXICILLIN-POT CLAVULANATE 875-125 MG PO TABS: 1.0000 | ORAL_TABLET | Freq: Two times a day (BID) | ORAL | Status: DC

## 2014-10-26 NOTE — Discharge Instructions (Signed)

## 2014-10-26 NOTE — ED Provider Notes (Signed)
CSN: [id number]     Arrival date & time 10/26/14  1044 History   First MD Initiated Contact with Patient 10/26/14 1101     Chief Complaint  Patient presents with  . Nasal Congestion  . Cough  . Hoarse  . Headache  . Wheezing   (Consider location/radiation/quality/duration/timing/severity/associated sxs/prior Treatment) Patient is a 34 y.o. female presenting with cough, headaches, and wheezing. The history is provided by the patient. No language interpreter was used.  Cough Cough characteristics:  Productive Sputum characteristics:  Nondescript Severity:  Moderate Onset quality:  Gradual Duration:  1 week Timing:  Constant Progression:  Worsening Chronicity:  New Smoker: no   Context: not upper respiratory infection   Relieved by:  Nothing Worsened by:  Nothing tried Ineffective treatments:  None tried Associated symptoms: headaches and wheezing   Headache Associated symptoms: cough   Wheezing Associated symptoms: cough and headaches     History reviewed. No pertinent past medical history. Past Surgical History  Procedure Laterality Date  . Leep     History reviewed. No pertinent family history. History  Substance Use Topics  . Smoking status: Never Smoker   . Smokeless tobacco: Not on file  . Alcohol Use: Yes   OB History    No data available     Review of Systems  Respiratory: Positive for cough and wheezing.   Neurological: Positive for headaches.  All other systems reviewed and are negative.   Allergies  Review of patient's allergies indicates no known allergies.  Home Medications   Prior to Admission medications   Medication Sig Start Date End Date Taking? Authorizing Provider  amoxicillin-clavulanate (AUGMENTIN) 875-125 MG per tablet Take 1 tablet by mouth every 12 (twelve) hours. 10/26/14   Elson AreasLeslie K Sofia, PA-C  nitrofurantoin, macrocrystal-monohydrate, (MACROBID) 100 MG capsule Take 1 capsule (100 mg total) by mouth 2 (two) times daily. Take with  food. 04/10/14   Lattie HawStephen A Beese, MD   BP 112/68 mmHg  Pulse 66  Temp(Src) 97.6 F (36.4 C) (Oral)  Resp 16  Ht 5\' 6"  (1.676 m)  Wt 220 lb (99.791 kg)  BMI 35.53 kg/m2  SpO2 98% Physical Exam  Constitutional: She is oriented to person, place, and time. She appears well-developed and well-nourished.  HENT:  Head: Normocephalic.  Right Ear: External ear normal.  Left Ear: External ear normal.  Nose: Nose normal.  Mouth/Throat: Oropharynx is clear and moist.  Tender frontal and maxillary sinuses  Eyes: Conjunctivae and EOM are normal. Pupils are equal, round, and reactive to light.  Neck: Normal range of motion.  Cardiovascular: Normal rate.   Pulmonary/Chest: Effort normal and breath sounds normal.  Abdominal: Soft. She exhibits no distension.  Musculoskeletal: Normal range of motion.  Neurological: She is alert and oriented to person, place, and time.  Psychiatric: She has a normal mood and affect.  Nursing note and vitals reviewed.   ED Course  Procedures (including critical care time) Labs Review Labs Reviewed - No data to display  Imaging Review No results found.   MDM   1. Acute ethmoidal sinusitis, recurrence not specified    Augmentin AVS See your Md for recheck next week    Elson AreasLeslie K Sofia, PA-C 10/26/14 1130

## 2014-10-26 NOTE — ED Notes (Signed)
Patient reports 1 week history of congestion, cough, hoarseness, sore throat from drainage, and some wheezing sensations. No recent OTCs. Denies fever.

## 2014-10-31 ENCOUNTER — Telehealth: Payer: Self-pay | Admitting: *Deleted

## 2015-01-01 DIAGNOSIS — K589 Irritable bowel syndrome without diarrhea: Secondary | ICD-10-CM | POA: Insufficient documentation

## 2015-01-01 DIAGNOSIS — R140 Abdominal distension (gaseous): Secondary | ICD-10-CM | POA: Insufficient documentation

## 2016-01-28 ENCOUNTER — Emergency Department (INDEPENDENT_AMBULATORY_CARE_PROVIDER_SITE_OTHER): Payer: BLUE CROSS/BLUE SHIELD

## 2016-01-28 ENCOUNTER — Emergency Department
Admission: EM | Admit: 2016-01-28 | Discharge: 2016-01-28 | Disposition: A | Discharge status: Home / Self Care | Payer: BLUE CROSS/BLUE SHIELD | Source: Home / Self Care | Attending: Emergency Medicine | Admitting: Emergency Medicine

## 2016-01-28 ENCOUNTER — Encounter: Payer: Self-pay | Admitting: *Deleted

## 2016-01-28 DIAGNOSIS — R05 Cough: Secondary | ICD-10-CM | POA: Diagnosis not present

## 2016-01-28 DIAGNOSIS — J189 Pneumonia, unspecified organism: Secondary | ICD-10-CM

## 2016-01-28 DIAGNOSIS — R509 Fever, unspecified: Secondary | ICD-10-CM | POA: Diagnosis not present

## 2016-01-28 LAB — POCT INFLUENZA A/B
Influenza A, POC: NEGATIVE
Influenza B, POC: NEGATIVE

## 2016-01-28 MED ORDER — LEVOFLOXACIN 500 MG PO TABS: 500.0000 mg | ORAL_TABLET | Freq: Every day | ORAL | Status: DC

## 2016-01-28 NOTE — ED Notes (Signed)
Pt c/o fever, body aches, and nonproductive cough x 1 day. She is taking Tylenol and IBF. Denies vomiting or diarrhea.

## 2016-01-28 NOTE — ED Provider Notes (Signed)
CSN: [id number]     Arrival date & time 01/28/16  1457 History   First MD Initiated Contact with Patient 01/28/16 1530     Chief Complaint  Patient presents with  . Cough   (Consider location/radiation/quality/duration/timing/severity/associated sxs/prior Treatment) Patient is a 35 y.o. female presenting with cough. The history is provided by the patient. No language interpreter was used.  Cough Cough characteristics:  Non-productive Severity:  Moderate Onset quality:  Gradual Duration:  2 days Timing:  Constant Progression:  Worsening Chronicity:  New Smoker: no   Relieved by:  Nothing Worsened by:  Nothing tried Ineffective treatments:  None tried Associated symptoms: headaches and myalgias   Associated symptoms: no fever and no rhinorrhea   Risk factors: no recent infection     History reviewed. No pertinent past medical history. Past Surgical History  Procedure Laterality Date  . Leep     History reviewed. No pertinent family history. Social History  Substance Use Topics  . Smoking status: Former Smoker    Quit date: 01/27/2006  . Smokeless tobacco: None  . Alcohol Use: No   OB History    No data available     Review of Systems  Constitutional: Negative for fever.  HENT: Negative for rhinorrhea.   Respiratory: Positive for cough.   Musculoskeletal: Positive for myalgias.  Neurological: Positive for headaches.  All other systems reviewed and are negative.   Allergies  Review of patient's allergies indicates no known allergies.  Home Medications   Prior to Admission medications   Medication Sig Start Date End Date Taking? Authorizing Provider  levofloxacin (LEVAQUIN) 500 MG tablet Take 1 tablet (500 mg total) by mouth daily. 01/28/16   Elson AreasLeslie K Sofia, PA-C   Meds Ordered and Administered this Visit  Medications - No data to display  BP 133/82 mmHg  Pulse 113  Temp(Src) 101.3 F (38.5 C) (Oral)  Resp 18  Ht 5\' 6"  (1.676 m)  Wt 226 lb (102.513 kg)   BMI 36.49 kg/m2  SpO2 96% No data found.   Physical Exam  Constitutional: She is oriented to person, place, and time. She appears well-developed and well-nourished.  HENT:  Head: Normocephalic.  Eyes: EOM are normal.  Neck: Normal range of motion.  Cardiovascular: Normal rate and normal heart sounds.   Pulmonary/Chest: Effort normal.  Rhonchi lower lobes, clears with cough,   Abdominal: Soft. She exhibits no distension.  Musculoskeletal: Normal range of motion.  Neurological: She is alert and oriented to person, place, and time.  Skin: Skin is warm.  Psychiatric: She has a normal mood and affect.  Nursing note and vitals reviewed.   ED Course  Procedures (including critical care time)  Labs Review Labs Reviewed  POCT INFLUENZA A/B    Imaging Review Dg Chest 2 View  01/28/2016  CLINICAL DATA:  35 year old female with cough and fever for 2 days. Initial encounter. EXAM: CHEST  2 VIEW COMPARISON:  None. FINDINGS: Subtle asymmetric patchy opacity at the left lung base which on the lateral may be associated with increased peribronchial opacity in the anterior basal segment of the left lower lobe. No pleural effusion. Lung markings elsewhere within normal limits. Normal cardiac size and mediastinal contours. Visualized tracheal air column is within normal limits. No acute osseous abnormality identified. Negative visible bowel gas pattern. IMPRESSION: Mild asymmetric opacity in the anterior left lower lobe suspicious for bronchopneumonia in this setting. No pleural effusion. Electronically Signed   By: Odessa FlemingH  Hall M.D.   On: 01/28/2016 16:01  Visual Acuity Review  Right Eye Distance:   Left Eye Distance:   Bilateral Distance:    Right Eye Near:   Left Eye Near:    Bilateral Near:         MDM Pt given rx for levaquin.  Pt advised to go to ED if symptoms worsen or change.   1. CAP (community acquired pneumonia)    Meds ordered this encounter  Medications  .  levofloxacin (LEVAQUIN) 500 MG tablet    Sig: Take 1 tablet (500 mg total) by mouth daily.    Dispense:  10 tablet    Refill:  0    Order Specific Question:  Supervising Provider    Answer:  Georgina Pillion, DAVID [[id number]]   An After Visit Summary was printed and given to the patient.    Lonia Skinner [place], PA-C 01/28/16 [phone]

## 2016-01-28 NOTE — Discharge Instructions (Signed)

## 2017-01-22 ENCOUNTER — Encounter: Payer: Self-pay | Admitting: Emergency Medicine

## 2017-01-22 ENCOUNTER — Emergency Department
Admission: EM | Admit: 2017-01-22 | Discharge: 2017-01-22 | Disposition: A | Discharge status: Home / Self Care | Payer: BLUE CROSS/BLUE SHIELD | Source: Home / Self Care | Attending: Family Medicine | Admitting: Family Medicine

## 2017-01-22 DIAGNOSIS — R21 Rash and other nonspecific skin eruption: Secondary | ICD-10-CM

## 2017-01-22 DIAGNOSIS — L0100 Impetigo, unspecified: Secondary | ICD-10-CM

## 2017-01-22 MED ORDER — MUPIROCIN 2 % EX OINT: TOPICAL_OINTMENT | CUTANEOUS | 0 refills | Status: DC

## 2017-01-22 MED ORDER — HYDROCORTISONE 2.5 % EX CREA: TOPICAL_CREAM | Freq: Two times a day (BID) | CUTANEOUS | 0 refills | Status: DC

## 2017-01-22 NOTE — ED Triage Notes (Signed)
Patient presents to [facility] with C/O rash on the front of the neck times two days, had a similar rash approximately 1 week ago and she states that she exfoliated the area and thinks that she spread it to the face.

## 2017-01-22 NOTE — ED Provider Notes (Signed)
CSN: [id number]     Arrival date & time 01/22/17  1110 History   First MD Initiated Contact with Patient 01/22/17 1149     Chief Complaint  Patient presents with  . Rash   (Consider location/radiation/quality/duration/timing/severity/associated sxs/prior Treatment) HPI  Toni Ortiz is a 36 y.o. female presenting to UC with c/o rash on the front of her neck that started 2 days ago and a similar rash that started 1 week ago on her face.  Pt notes she uses a metal razor to exfoliate her face and is worried she has caused the rash to spread.  She does not use any cream or makeup on her face and denies using soap when she exfoliates but states she cleans the metal blade with alcohol.  Rash is itching and burning.  No other rashes to arms, legs, trunk or back.    History reviewed. No pertinent past medical history. Past Surgical History:  Procedure Laterality Date  . LEEP     History reviewed. No pertinent family history. Social History  Substance Use Topics  . Smoking status: Former Smoker    Quit date: 01/27/2006  . Smokeless tobacco: Never Used  . Alcohol use No   OB History    No data available     Review of Systems  Constitutional: Negative for chills and fever.  HENT: Negative for facial swelling.   Skin: Positive for color change, rash and wound. Negative for pallor.    Allergies  Patient has no known allergies.  Home Medications   Prior to Admission medications   Medication Sig Start Date End Date Taking? Authorizing Provider  phentermine 37.5 MG capsule Take 37.5 mg by mouth every morning.   Yes Historical Provider, MD  hydrocortisone 2.5 % cream Apply topically 2 (two) times daily. For no longer than 1 week on the face 01/22/17   Junius Finner, PA-C  levofloxacin (LEVAQUIN) 500 MG tablet Take 1 tablet (500 mg total) by mouth daily. 01/28/16   Elson Areas, PA-C  mupirocin ointment (BACTROBAN) 2 % Apply to rashes 3 times daily got 5 days 01/22/17   Junius Finner, PA-C    Meds Ordered and Administered this Visit  Medications - No data to display  BP 115/72 (BP Location: Left Arm)   Pulse 62   Temp 97.9 F (36.6 C) (Oral)   Resp 16   Ht  (1.676 m)   Wt 185 lb 8 oz (84.1 kg)   SpO2 100%   BMI 29.94 kg/m  No data found.   Physical Exam  Constitutional: She is oriented to person, place, and time. She appears well-developed and well-nourished.  HENT:  Head: Normocephalic and atraumatic.    Erythema to face with areas of dried skin on bilateral sides of face and lips.  Erythematous rash that has appearance of superficial abrasion. No active bleeding or drainage.   Eyes: EOM are normal.  Neck: Normal range of motion.  Cardiovascular: Normal rate.   Pulmonary/Chest: Effort normal.  Musculoskeletal: Normal range of motion.  Neurological: She is alert and oriented to person, place, and time.  Skin: Skin is warm and dry. Rash noted. There is erythema.  Psychiatric: She has a normal mood and affect. Her behavior is normal.  Nursing note and vitals reviewed.   Urgent Care Course     Procedures (including critical care time)  Labs Review Labs Reviewed - No data to display  Imaging Review No results found.    MDM   1.  Impetigo   2. Rash    Hx and exam concerning for impetigo.  Discussed use of metal razor being used for exfoliating.  Encouraged to use warm water and soap with mild lotions to help with skin dryness.   Will treat for potential underlying skin infection and eczema.  Rx: mupirocin ointment and hydrocortisone cream 2.5% f/u with PCP in 1 week if not improving.    Junius Finner, PA-C 01/22/17 1416

## 2017-03-27 IMAGING — DX DG CHEST 2V
2 series · 2 of 2 positions shown · non-contrast
Comparison: None.

CLINICAL DATA: 34-year-old female with cough and fever for 2 days.
Initial encounter.

EXAM:
CHEST  2 VIEW

[chest pa]
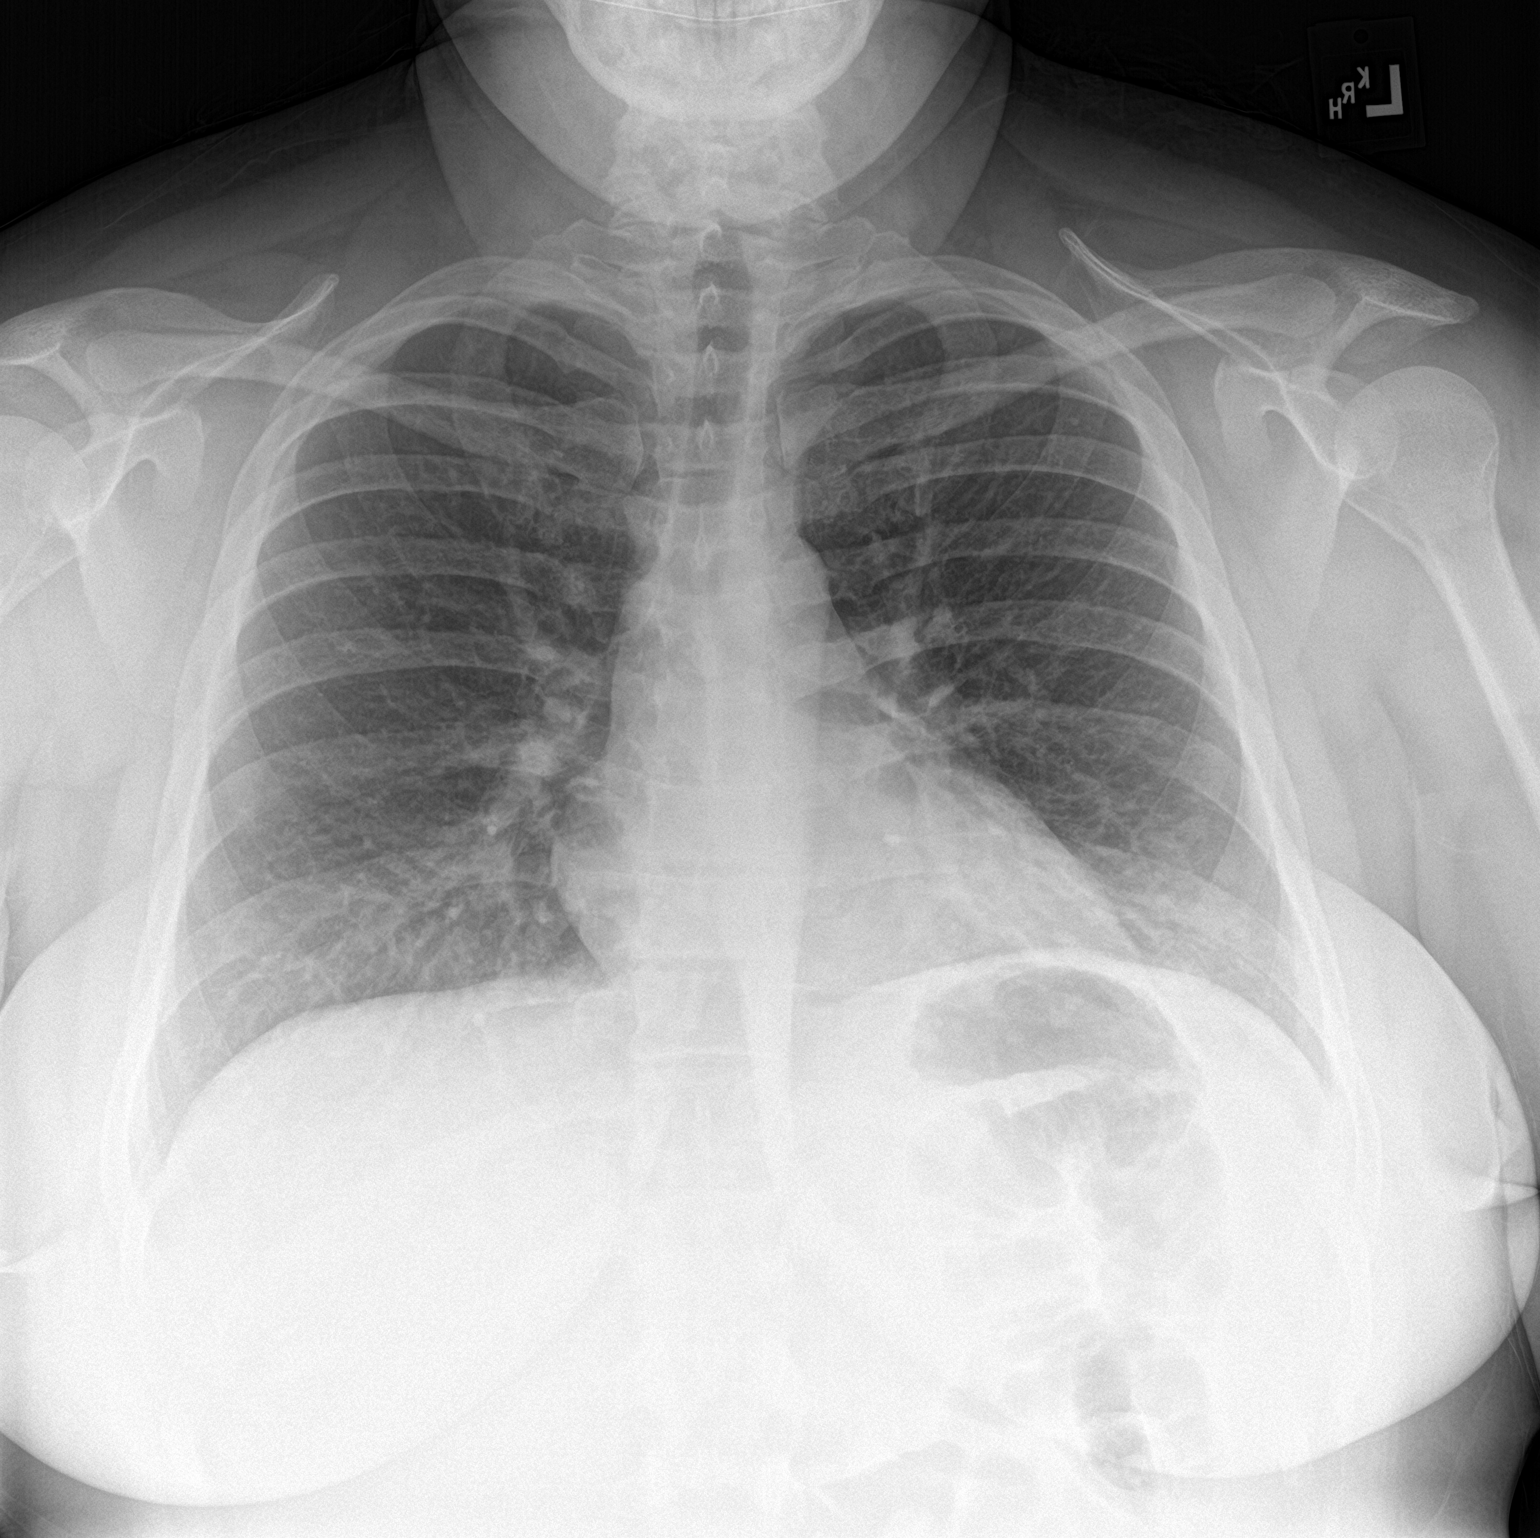

[chest lat]
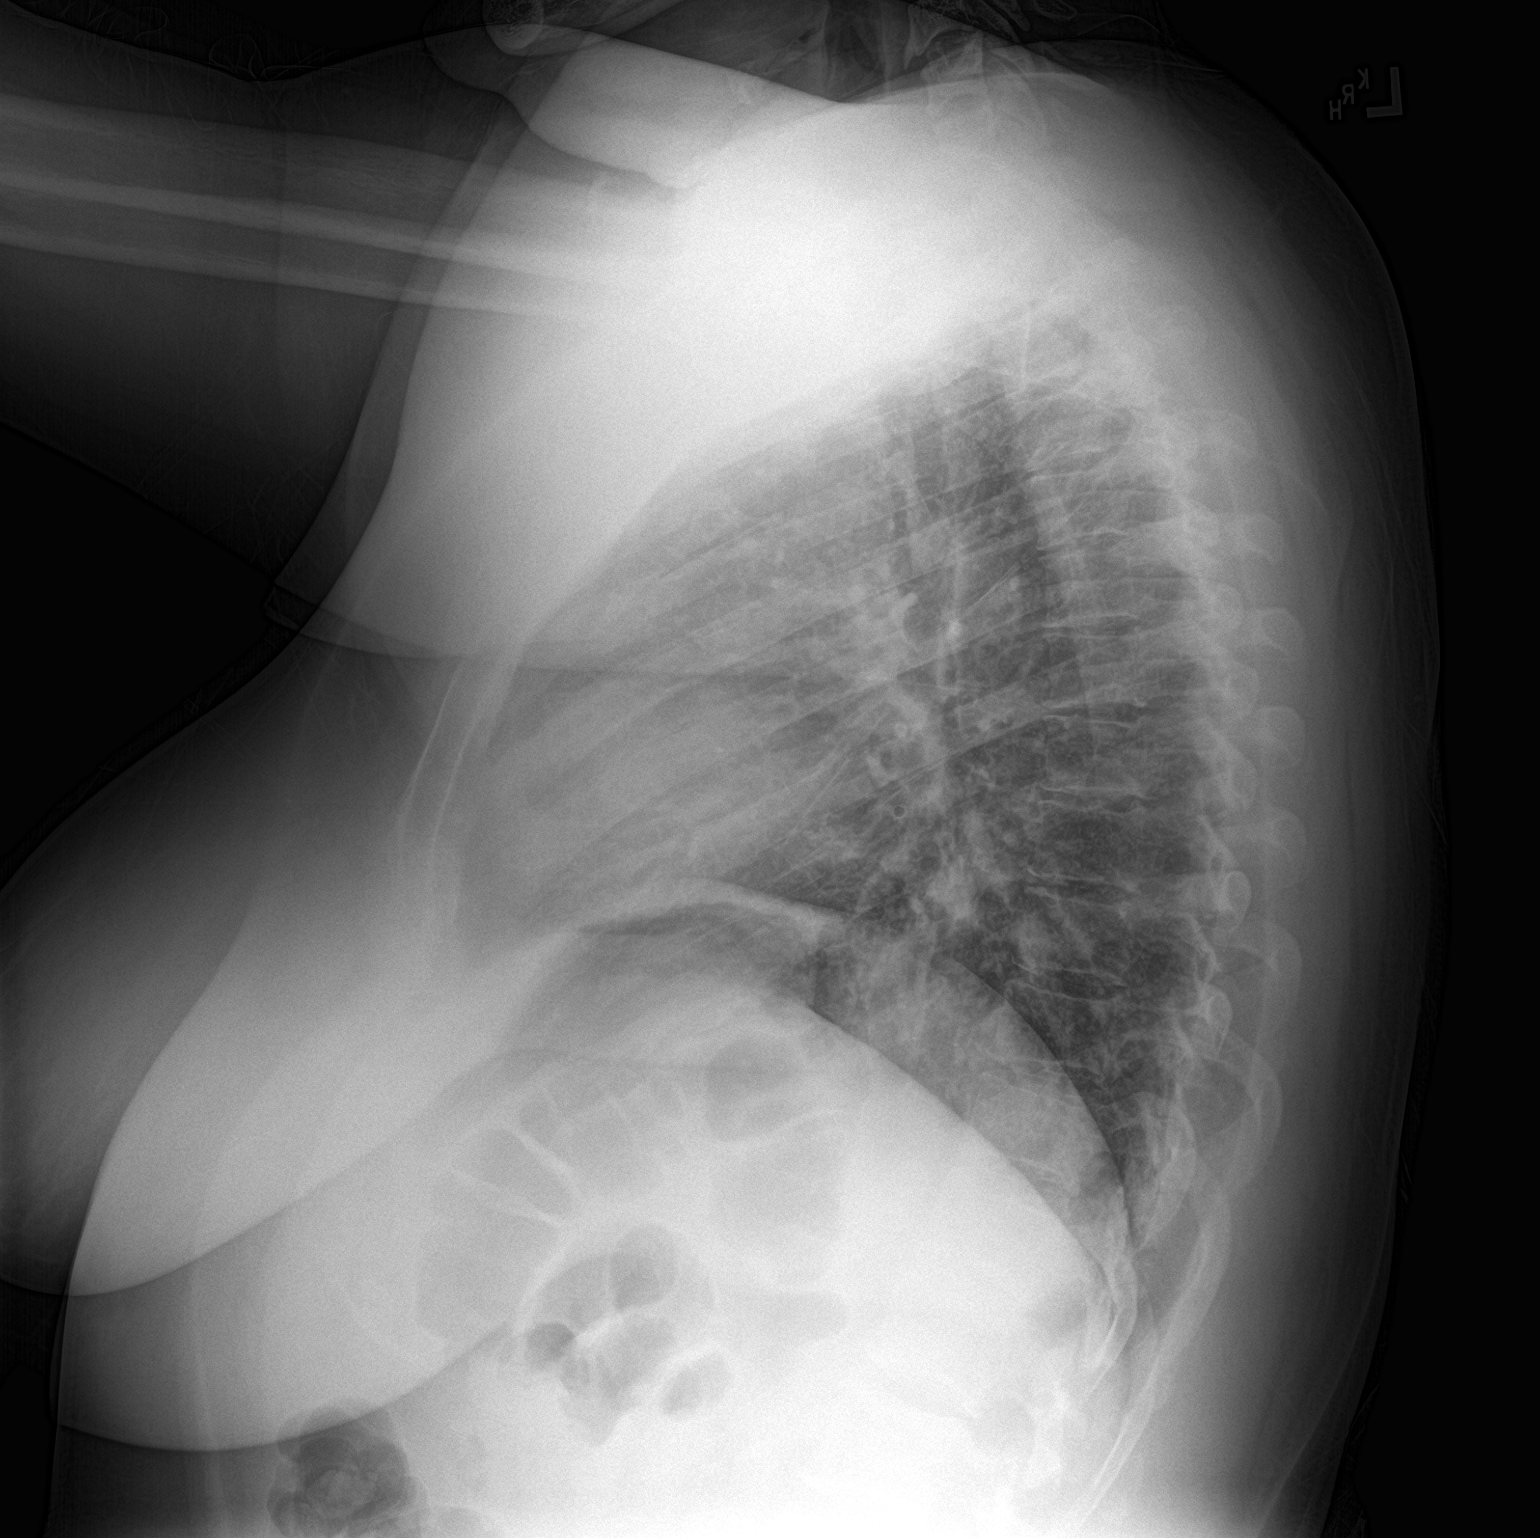

[2 of 2 positions shown; findings below may reference images not displayed]

FINDINGS: Subtle asymmetric patchy opacity at the left lung base which on the
lateral may be associated with increased peribronchial opacity in
the anterior basal segment of the left lower lobe. No pleural
effusion. Lung markings elsewhere within normal limits. Normal
cardiac size and mediastinal contours. Visualized tracheal air
column is within normal limits. No acute osseous abnormality
identified. Negative visible bowel gas pattern.
IMPRESSION: Mild asymmetric opacity in the anterior left lower lobe suspicious
for bronchopneumonia in this setting. No pleural effusion.

## 2018-02-14 ENCOUNTER — Emergency Department
Admission: EM | Admit: 2018-02-14 | Discharge: 2018-02-14 | Disposition: A | Discharge status: Home / Self Care | Payer: BLUE CROSS/BLUE SHIELD | Source: Home / Self Care

## 2018-02-14 DIAGNOSIS — R509 Fever, unspecified: Secondary | ICD-10-CM

## 2018-02-14 DIAGNOSIS — J069 Acute upper respiratory infection, unspecified: Secondary | ICD-10-CM

## 2018-02-14 DIAGNOSIS — J029 Acute pharyngitis, unspecified: Secondary | ICD-10-CM | POA: Diagnosis not present

## 2018-02-14 LAB — POCT RAPID STREP A (OFFICE): Rapid Strep A Screen: NEGATIVE

## 2018-02-14 LAB — POCT INFLUENZA A/B
INFLUENZA A, POC: NEGATIVE
INFLUENZA B, POC: NEGATIVE

## 2018-02-14 NOTE — ED Triage Notes (Signed)
Toni Ortiz complains of fever, body aches, chills, sweats and sore throat for 3 days. She took tylenol around 3 pm today.

## 2018-02-14 NOTE — Discharge Instructions (Signed)
Return if any problems.

## 2018-02-15 NOTE — ED Provider Notes (Signed)
[facility]    CSN: [id number] Arrival date & time: 02/14/18  1755     History   Chief Complaint Chief Complaint  Patient presents with  . Fever    x 3 days  . Generalized Body Aches    x 3 days  . Cough    dry cough x 3 days    HPI Toni Ortiz is a 37 y.o. female.   The history is provided by the patient. No language interpreter was used.  Fever  Max temp prior to arrival:  101 Temp source:  Subjective Severity:  Moderate Onset quality:  Gradual Duration:  3 days Timing:  Constant Progression:  Worsening Chronicity:  New Relieved by:  Nothing Worsened by:  Nothing Ineffective treatments:  None tried Associated symptoms: cough   Risk factors: no sick contacts   Cough  Associated symptoms: fever     History reviewed. No pertinent past medical history.  Patient Active Problem List   Diagnosis Date Noted  . ALLERGIC RHINITIS 07/18/2011  . ASTHMA 07/18/2011    Past Surgical History:  Procedure Laterality Date  . LEEP      OB History   None      Home Medications    Prior to Admission medications   Medication Sig Start Date End Date Taking? Authorizing Provider  hydrocortisone 2.5 % cream Apply topically 2 (two) times daily. For no longer than 1 week on the face 01/22/17   Lurene Shadow, PA-C  levofloxacin (LEVAQUIN) 500 MG tablet Take 1 tablet (500 mg total) by mouth daily. 01/28/16   Elson Areas, PA-C  mupirocin ointment (BACTROBAN) 2 % Apply to rashes 3 times daily got 5 days 01/22/17   Lurene Shadow, PA-C  phentermine 37.5 MG capsule Take 37.5 mg by mouth every morning.    [provider]    Family History Family History  Problem Relation Age of Onset  . Diabetes Paternal Grandmother   . Hypertension Paternal Grandmother   . Hypertension Maternal Grandfather   . Hypertension Maternal Grandmother   . Hypertension Paternal Grandfather     Social History Social History   Tobacco Use  . Smoking status: Former  Smoker    Last attempt to quit: 01/27/2006    Years since quitting: 12.0  . Smokeless tobacco: Never Used  Substance Use Topics  . Alcohol use: No  . Drug use: No     Allergies   Patient has no known allergies.   Review of Systems Review of Systems  Constitutional: Positive for fever.  Respiratory: Positive for cough.   All other systems reviewed and are negative.    Physical Exam Triage Vital Signs ED Triage Vitals  Enc Vitals Group     BP 02/14/18 1828 138/89     Pulse Rate 02/14/18 1828 80     Resp 02/14/18 1828 18     Temp 02/14/18 1828 98.4 F (36.9 C)     Temp Source 02/14/18 1828 Oral     SpO2 02/14/18 1828 99 %     Weight 02/14/18 1830 192 lb (87.1 kg)     Height 02/14/18 1830  (1.676 m)     Head Circumference --      Peak Flow --      Pain Score 02/14/18 1829 1     Pain Loc --      Pain Edu? --      Excl. in GC? --    No data found.  Updated Vital Signs BP 138/89 (BP Location: Right Arm)   Pulse 80   Temp 98.4 F (36.9 C) (Oral)   Resp 18   Ht  (1.676 m)   Wt 192 lb (87.1 kg)   SpO2 99%   BMI 30.99 kg/m   Visual Acuity Right Eye Distance:   Left Eye Distance:   Bilateral Distance:    Right Eye Near:   Left Eye Near:    Bilateral Near:     Physical Exam  Constitutional: She appears well-developed and well-nourished.  HENT:  Head: Normocephalic.  Right Ear: External ear normal.  Left Ear: External ear normal.  Mouth/Throat: Oropharynx is clear and moist.  Cardiovascular: Normal rate and regular rhythm.  Pulmonary/Chest: Effort normal.  Abdominal: Soft.  Musculoskeletal: Normal range of motion.  Neurological: She is alert.  Skin: Skin is warm.  Psychiatric: She has a normal mood and affect.  Nursing note and vitals reviewed.    UC Treatments / Results  Labs (all labs ordered are listed, but only abnormal results are displayed) Labs Reviewed  POCT INFLUENZA A/B - Normal  POCT RAPID STREP A (OFFICE)     EKG None  Radiology No results found.  Procedures Procedures (including critical care time)  Medications Ordered in UC Medications - No data to display  Initial Impression / Assessment and Plan / UC Course  I have reviewed the triage vital signs and the nursing notes.  Pertinent labs & imaging results that were available during my care of the patient were reviewed by me and considered in my medical decision making (see chart for details).     MDM  Symptoms consistent with viral infection.  Final Clinical Impressions(s) / UC Diagnoses   Final diagnoses:  Sore throat  Fever, unspecified  Upper respiratory tract infection, unspecified type     Discharge Instructions     Return if any problems.   ED Prescriptions    None     Controlled Substance Prescriptions [place] Controlled Substance Registry consulted? No  An After Visit Summary was printed and given to the patient.    Elson Areas, [place] 02/15/18 2052

## 2019-11-23 DIAGNOSIS — S93401A Sprain of unspecified ligament of right ankle, initial encounter: Secondary | ICD-10-CM | POA: Insufficient documentation

## 2020-06-12 ENCOUNTER — Other Ambulatory Visit: Payer: Self-pay

## 2020-06-12 ENCOUNTER — Emergency Department (INDEPENDENT_AMBULATORY_CARE_PROVIDER_SITE_OTHER): Payer: BC Managed Care – PPO

## 2020-06-12 ENCOUNTER — Emergency Department
Admission: EM | Admit: 2020-06-12 | Discharge: 2020-06-12 | Disposition: A | Discharge status: Home / Self Care | Payer: BC Managed Care – PPO | Source: Home / Self Care

## 2020-06-12 ENCOUNTER — Encounter: Payer: Self-pay | Admitting: Emergency Medicine

## 2020-06-12 ENCOUNTER — Encounter (HOSPITAL_COMMUNITY): Payer: Self-pay | Admitting: Emergency Medicine

## 2020-06-12 ENCOUNTER — Observation Stay (HOSPITAL_COMMUNITY)
Admission: EM | Admit: 2020-06-12 | Discharge: 2020-06-13 | Disposition: A | Discharge status: Home / Self Care | Payer: BC Managed Care – PPO | Attending: Physician Assistant | Admitting: Physician Assistant

## 2020-06-12 DIAGNOSIS — R1011 Right upper quadrant pain: Secondary | ICD-10-CM

## 2020-06-12 DIAGNOSIS — Z79899 Other long term (current) drug therapy: Secondary | ICD-10-CM | POA: Insufficient documentation

## 2020-06-12 DIAGNOSIS — R109 Unspecified abdominal pain: Secondary | ICD-10-CM | POA: Diagnosis not present

## 2020-06-12 DIAGNOSIS — K8010 Calculus of gallbladder with chronic cholecystitis without obstruction: Secondary | ICD-10-CM | POA: Diagnosis present

## 2020-06-12 DIAGNOSIS — K810 Acute cholecystitis: Principal | ICD-10-CM | POA: Insufficient documentation

## 2020-06-12 DIAGNOSIS — K819 Cholecystitis, unspecified: Secondary | ICD-10-CM

## 2020-06-12 DIAGNOSIS — Z20822 Contact with and (suspected) exposure to covid-19: Secondary | ICD-10-CM | POA: Insufficient documentation

## 2020-06-12 DIAGNOSIS — M549 Dorsalgia, unspecified: Secondary | ICD-10-CM | POA: Insufficient documentation

## 2020-06-12 DIAGNOSIS — R011 Cardiac murmur, unspecified: Secondary | ICD-10-CM | POA: Insufficient documentation

## 2020-06-12 DIAGNOSIS — E785 Hyperlipidemia, unspecified: Secondary | ICD-10-CM | POA: Insufficient documentation

## 2020-06-12 DIAGNOSIS — G4733 Obstructive sleep apnea (adult) (pediatric): Secondary | ICD-10-CM | POA: Insufficient documentation

## 2020-06-12 LAB — COMPREHENSIVE METABOLIC PANEL
ALT: 31 U/L (ref 0–44)
AST: 25 U/L (ref 15–41)
Albumin: 5 g/dL (ref 3.5–5.0)
Alkaline Phosphatase: 50 U/L (ref 38–126)
Anion gap: 13 (ref 5–15)
BUN: 12 mg/dL (ref 6–20)
CO2: 22 mmol/L (ref 22–32)
Calcium: 9.4 mg/dL (ref 8.9–10.3)
Chloride: 106 mmol/L (ref 98–111)
Creatinine, Ser: 0.78 mg/dL (ref 0.44–1.00)
GFR calc Af Amer: >60 mL/min (ref >60)
GFR calc non Af Amer: >60 mL/min (ref >60)
Glucose, Bld: 84 mg/dL (ref 70–99)
Potassium: 3.7 mmol/L (ref 3.5–5.1)
Sodium: 141 mmol/L (ref 135–145)
Total Bilirubin: 0.6 mg/dL (ref 0.3–1.2)
Total Protein: 8.1 g/dL (ref 6.5–8.1)

## 2020-06-12 LAB — POCT CBC W AUTO DIFF (K'VILLE URGENT CARE)

## 2020-06-12 LAB — URINALYSIS, ROUTINE W REFLEX MICROSCOPIC
Bilirubin Urine: NEGATIVE
Glucose, UA: NEGATIVE mg/dL
Hgb urine dipstick: NEGATIVE
Ketones, ur: 5 mg/dL — AB
Leukocytes,Ua: NEGATIVE
Nitrite: NEGATIVE
Protein, ur: NEGATIVE mg/dL
Specific Gravity, Urine: 1.015 (ref 1.005–1.030)
pH: 6 (ref 5.0–8.0)

## 2020-06-12 LAB — POCT URINALYSIS DIP (MANUAL ENTRY)
Bilirubin, UA: NEGATIVE
Blood, UA: NEGATIVE
Glucose, UA: NEGATIVE mg/dL
Ketones, POC UA: NEGATIVE mg/dL
Leukocytes, UA: NEGATIVE
Nitrite, UA: NEGATIVE
Protein Ur, POC: NEGATIVE mg/dL
Spec Grav, UA: 1.01 (ref 1.010–1.025)
Urobilinogen, UA: 0.2 E.U./dL
pH, UA: 7 (ref 5.0–8.0)

## 2020-06-12 LAB — CBC
HCT: 39.6 % (ref 36.0–46.0)
Hemoglobin: 13 g/dL (ref 12.0–15.0)
MCH: 30 pg (ref 26.0–34.0)
MCHC: 32.8 g/dL (ref 30.0–36.0)
MCV: 91.5 fL (ref 80.0–100.0)
Platelets: 337 10*3/uL (ref 150–400)
RBC: 4.33 MIL/uL (ref 3.87–5.11)
RDW: 13.1 % (ref 11.5–15.5)
WBC: 11.9 10*3/uL — ABNORMAL HIGH (ref 4.0–10.5)
nRBC: 0 % (ref 0.0–0.2)

## 2020-06-12 LAB — I-STAT BETA HCG BLOOD, ED (MC, WL, AP ONLY): I-stat hCG, quantitative: <5 m[IU]/mL (ref <5)

## 2020-06-12 LAB — SARS CORONAVIRUS 2 BY RT PCR (HOSPITAL ORDER, PERFORMED IN ~~LOC~~ HOSPITAL LAB): SARS Coronavirus 2: NEGATIVE

## 2020-06-12 LAB — MRSA PCR SCREENING: MRSA by PCR: NEGATIVE

## 2020-06-12 LAB — LIPASE, BLOOD: Lipase: 27 U/L (ref 11–51)

## 2020-06-12 MED ORDER — SODIUM CHLORIDE 0.9 % IV SOLN
2.0000 g | INTRAVENOUS | Status: DC
  Administered 2020-06-12: 20:00:00 2 g via INTRAVENOUS
  Filled 2020-06-12: qty 20
  Filled 2020-06-12: qty 2

## 2020-06-12 MED ORDER — ENOXAPARIN SODIUM 40 MG/0.4ML ~~LOC~~ SOLN: 40.0000 mg | SUBCUTANEOUS | Status: DC

## 2020-06-12 MED ORDER — ACETAMINOPHEN 325 MG PO TABS: 650.0000 mg | ORAL_TABLET | Freq: Four times a day (QID) | ORAL | Status: DC | PRN

## 2020-06-12 MED ORDER — OXYCODONE HCL 5 MG PO TABS: 5.0000 mg | ORAL_TABLET | ORAL | Status: DC | PRN

## 2020-06-12 MED ORDER — ACETAMINOPHEN 650 MG RE SUPP: 650.0000 mg | Freq: Four times a day (QID) | RECTAL | Status: DC | PRN

## 2020-06-12 MED ORDER — ONDANSETRON 4 MG PO TBDP
4.0000 mg | ORAL_TABLET | Freq: Four times a day (QID) | ORAL | Status: DC | PRN
  Administered 2020-06-13: 4 mg via ORAL
  Filled 2020-06-12: qty 1

## 2020-06-12 MED ORDER — HYDROMORPHONE HCL 1 MG/ML IJ SOLN
1.0000 mg | INTRAMUSCULAR | Status: DC | PRN
  Administered 2020-06-13: 15:00:00 1 mg via INTRAVENOUS
  Filled 2020-06-12: qty 1

## 2020-06-12 MED ORDER — ONDANSETRON HCL 4 MG/2ML IJ SOLN: 4.0000 mg | Freq: Four times a day (QID) | INTRAMUSCULAR | Status: DC | PRN

## 2020-06-12 MED ORDER — KETOROLAC TROMETHAMINE 30 MG/ML IJ SOLN: 30.0000 mg | Freq: Four times a day (QID) | INTRAMUSCULAR | Status: DC | PRN

## 2020-06-12 MED ORDER — POTASSIUM CHLORIDE IN NACL 20-0.9 MEQ/L-% IV SOLN
INTRAVENOUS | Status: DC
  Filled 2020-06-12 (×2): qty 1000

## 2020-06-12 NOTE — ED Notes (Signed)
RN called transport to take pt to room 1316

## 2020-06-12 NOTE — ED Triage Notes (Signed)
C/o RUQ pain x 3 days, pain increases with movement - sharp pain Pt normally sleeps on right side - unable to do so due to pain At rest - feels like a pulled muscle Denies any N/V/D or fever or new shoulder pain No OTC meds No hx of GERD No COVID Vaccine

## 2020-06-12 NOTE — H&P (Signed)
Toni Ortiz is an 39 y.o. female.   Chief Complaint: RUQ abdominal pain  HPI: This is a 39 year old female who presents with a 3-day history of right upper quadrant abdominal pain.  It is mild and described as a dull ache.  She has no nausea, vomiting, or bloating.  She has had no previous attacks of gallstones.  She underwent an ultrasound showing her to have distended gallbladder with gallstones with the largest being in was 2.5 cm.  Her white blood count was elevated.  Her liver function tests are normal.  She is otherwise without complaints.  She has no fever, chills, or upper respiratory symptoms.  History reviewed. No pertinent past medical history.  Past Surgical History:  Procedure Laterality Date  . LEEP      Family History  Problem Relation Age of Onset  . Diabetes Paternal Grandmother   . Hypertension Paternal Grandmother   . Hypertension Maternal Grandfather   . Hypertension Maternal Grandmother   . Hypertension Paternal Grandfather   . Healthy Mother   . Healthy Father   . Healthy Sister   . Healthy Brother   . Healthy Brother    Social History:  reports that she quit smoking about 14 years ago. She has never used smokeless tobacco. She reports that she does not drink alcohol and does not use drugs.  Allergies:  Allergies  Allergen Reactions  . Atorvastatin Other (See Comments)    (Not in a hospital admission)   Results for orders placed or performed during the hospital encounter of 06/12/20 (from the past 48 hour(s))  Lipase, blood     Status: None   Collection Time: 06/12/20  3:41 PM  Result Value Ref Range   Lipase 27 11 - 51 U/L    Comment: Performed at [facility], [address], [place], [place] [address]  Comprehensive metabolic panel     Status: None   Collection Time: 06/12/20  3:41 PM  Result Value Ref Range   Sodium 141 135 - 145 mmol/L   Potassium 3.7 3.5 - 5.1 mmol/L   Chloride 106 98 - 111 mmol/L   CO2 22 22 - 32  mmol/L   Glucose, Bld 84 70 - 99 mg/dL    Comment: Glucose reference range applies only to samples taken after fasting for at least 8 hours.   BUN 12 6 - 20 mg/dL   Creatinine, Ser 5.45 0.44 - 1.00 mg/dL   Calcium 9.4 8.9 - 62.5 mg/dL   Total Protein 8.1 6.5 - 8.1 g/dL   Albumin 5.0 3.5 - 5.0 g/dL   AST 25 15 - 41 U/L   ALT 31 0 - 44 U/L   Alkaline Phosphatase 50 38 - 126 U/L   Total Bilirubin 0.6 0.3 - 1.2 mg/dL   GFR calc non Af Amer >60 >60 mL/min   GFR calc Af Amer >60 >60 mL/min   Anion gap 13 5 - 15    Comment: Performed at [facility], [address], [place], [place] [address]  CBC     Status: Abnormal   Collection Time: 06/12/20  3:41 PM  Result Value Ref Range   WBC 11.9 (H) 4.0 - 10.5 K/uL   RBC 4.33 3.87 - 5.11 MIL/uL   Hemoglobin 13.0 12.0 - 15.0 g/dL   HCT 73.4 36 - 46 %   MCV 91.5 80.0 - 100.0 fL   MCH 30.0 26.0 - 34.0 pg   MCHC 32.8 30.0 - 36.0  g/dL   RDW 16.113.1 09.611.5 - 04.515.5 %   Platelets 337 150 - 400 K/uL   nRBC 0.0 0.0 - 0.2 %    Comment: Performed at [facility], [address] [place], [place] [address]  I-Stat beta hCG blood, ED     Status: None   Collection Time: 06/12/20  3:45 PM  Result Value Ref Range   I-stat hCG, quantitative <5.0 <5 mIU/mL   Comment 3            Comment:   GEST. AGE      CONC.  (mIU/mL)   <=1 WEEK        5 - 50     2 WEEKS       50 - 500     3 WEEKS       100 - 10,000     4 WEEKS     1,000 - 30,000        FEMALE AND NON-PREGNANT FEMALE:     LESS THAN 5 mIU/mL   SARS Coronavirus 2 by RT PCR (hospital order, performed in [facility] hospital lab) Nasopharyngeal Nasopharyngeal Swab     Status: None   Collection Time: 06/12/20  5:21 PM   Specimen: Nasopharyngeal Swab  Result Value Ref Range   SARS Coronavirus 2 NEGATIVE NEGATIVE    Comment: (NOTE) SARS-CoV-2 target nucleic acids are NOT DETECTED.  The SARS-CoV-2 RNA is generally detectable in upper and lower respiratory specimens during  the acute phase of infection. The lowest concentration of SARS-CoV-2 viral copies this assay can detect is 250 copies / mL. A negative result does not preclude SARS-CoV-2 infection and should not be used as the sole basis for treatment or other patient management decisions.  A negative result may occur with improper specimen collection / handling, submission of specimen other than nasopharyngeal swab, presence of viral mutation(s) within the areas targeted by this assay, and inadequate number of viral copies (<250 copies / mL). A negative result must be combined with clinical observations, patient history, and epidemiological information.  Fact Sheet for Patients:   BoilerBrush.com.cyhttps://www.fda.gov/media/136312/download  Fact Sheet for Healthcare Providers: https://pope.com/https://www.fda.gov/media/136313/download  This test is not yet approved or  cleared by the [place] FDA and has been authorized for detection and/or diagnosis of SARS-CoV-2 by FDA under an Emergency Use Authorization (EUA).  This EUA will remain in effect (meaning this test can be used) for the duration of the COVID-19 declaration under Section 564(b)(1) of the Act, 21 U.S.C. section 360bbb-3(b)(1), unless the authorization is terminated or revoked sooner.  Performed at [facility], [address] [place], [place] [address]   Urinalysis, Routine w reflex microscopic     Status: Abnormal   Collection Time: 06/12/20  5:30 PM  Result Value Ref Range   Color, Urine YELLOW YELLOW   APPearance CLEAR CLEAR   Specific Gravity, Urine 1.015 1.005 - 1.030   pH 6.0 5.0 - 8.0   Glucose, UA NEGATIVE NEGATIVE mg/dL   Hgb urine dipstick NEGATIVE NEGATIVE   Bilirubin Urine NEGATIVE NEGATIVE   Ketones, ur 5 (A) NEGATIVE mg/dL   Protein, ur NEGATIVE NEGATIVE mg/dL   Nitrite NEGATIVE NEGATIVE   Leukocytes,Ua NEGATIVE NEGATIVE    Comment: Performed at [facility], [address] [place], [place] [address]    US Abdomen Limited RUQ  Result Date: 06/12/2020 CLINICAL DATA:  Right upper quadrant pain EXAM: ULTRASOUND ABDOMEN LIMITED RIGHT UPPER QUADRANT COMPARISON:  None. FINDINGS: Gallbladder:  Gallbladder is distended. There are echogenic foci in the gallbladder which move and shadow consistent with cholelithiasis. Largest gallstone measures 2.4 cm in length. The gallbladder wall does not appear appreciably thickened. The patient is focally tender over this dilated gallbladder. Common bile duct: Diameter: 3 mm in visualized portion. Most of the common bile duct is obscured by gas. No intrahepatic biliary duct dilatation is appreciable. Liver: There is an apparent cyst in the left lobe of the liver measuring 1.2 x 1.0 x 0.8 cm. No other focal liver lesion evident. Liver echogenicity overall is increased diffusely. Portal vein is patent on color Doppler imaging with normal direction of blood flow towards the liver. Other: None. IMPRESSION: 1. Distended gallbladder with cholelithiasis and focal tenderness over the gallbladder. These findings are concerning for a degree of acute cholecystitis. 2. Only slight visualization of the biliary ductal system due to overlying gas. 3. Apparent 1.2 x 1.0 x 0.8 cm cyst in left lobe of liver. The liver echogenicity is increased diffusely consistent with hepatic steatosis and potential underlying parenchymal liver disease. Note that there are no noncystic liver lesions appreciable on this study. The sensitivity of ultrasound for detection of noncystic liver lesions is diminished significantly given this degree of diffuse increased liver echogenicity secondary to the hepatic steatosis. These results will be called to the ordering clinician or representative by the Radiologist Assistant, and communication documented in the PACS or [employer]. Electronically Signed   By: Bretta Bang III M.D.   On: 06/12/2020 12:58    Review of Systems  Constitutional: Negative for chills  and fever.  Respiratory: Negative for cough and shortness of breath.   Cardiovascular: Negative for chest pain.  Gastrointestinal: Positive for abdominal pain. Negative for nausea and vomiting.  Genitourinary: Negative for dysuria.  All other systems reviewed and are negative.   Blood pressure (!) 145/81, pulse 62, temperature 98.5 F (36.9 C), temperature source Oral, resp. rate 18, height 5\' 6"  (1.676 m), weight 99.8 kg, SpO2 100 %. Physical Exam Vitals reviewed.  Constitutional:      General: She is not in acute distress.    Appearance: She is well-developed. She is not ill-appearing or diaphoretic.  HENT:     Head: Normocephalic and atraumatic.     Mouth/Throat:     Pharynx: Oropharynx is clear.  Eyes:     General: No scleral icterus.    Extraocular Movements: Extraocular movements intact.  Cardiovascular:     Rate and Rhythm: Normal rate and regular rhythm.  Pulmonary:     Effort: Pulmonary effort is normal. No respiratory distress.     Breath sounds: No stridor.  Abdominal:     General: There is no distension.     Palpations: Abdomen is soft.     Tenderness: There is abdominal tenderness in the right upper quadrant. There is guarding.     Hernia: No hernia is present.  Skin:    General: Skin is warm and dry.  Neurological:     General: No focal deficit present.     Mental Status: She is alert.  Psychiatric:        Mood and Affect: Mood normal.        Behavior: Behavior normal.      Assessment/Plan Acute cholecystitis with cholelithiasis  I explained the diagnosis to the patient.  It is recommended that she be admitted to the hospital for IV antibiotics and undergo a laparoscopic cholecystectomy in the morning.  I discussed the reasons for this with  her.  I discussed proceeding with a laparoscopic cholecystectomy which would be by one of my partners in the morning, Dr. Derrell Lolling.  I discussed the surgical procedure with her.  I discussed the risk which includes but is  not limited to bleeding, infection, injury to surrounding structures, the need to convert to open procedure, cardiopulmonary issues, etc.  She is currently Covid negative.  She will be n.p.o. tonight.  I will allow her have clear liquids up until midnight.  IV antibiotics have been ordered.  She understands and agrees with the plans.  Abigail Miyamoto, MD 06/12/2020, 7:20 PM

## 2020-06-12 NOTE — ED Triage Notes (Signed)
Pt reports pain in abdomen starting 4 days ago, getting worse yesterday. Pain in RUQ. Reports that Urgent Care sent her here to get her gallbladder removed due to cholecystitis.

## 2020-06-12 NOTE — Discharge Instructions (Addendum)
  You have declined EMS transport, however, your work up in urgent care this morning is consistent with an infection of your gallbladder known as cholecystitis.  Emergent surgery will likely be performed to remove your gallbladder to help treat the infection. Please drive directly to the emergency department for further evaluation and treatment. Do not eat or drink anything until reevaluated by another medical provider and given permission to do so.

## 2020-06-12 NOTE — ED Provider Notes (Signed)
[facility]    CSN: [id number] Arrival date & time: 06/12/20  1130      History   Chief Complaint No chief complaint on file.   HPI Toni Ortiz is a 39 y.o. female.   HPI  Toni Ortiz is a 39 y.o. female presenting to UC with c/o 3 days gradually worsening RUQ abdominal pain that is worse with movement, sharp and stabbing. Was unable to sleep due to pain last night.  Feels like a pulled muscle but denies injury.  No pain medication taken PTA.  Denies fever, chills, n/v/d. Family hx of gallbladder disease.     History reviewed. No pertinent past medical history.  Patient Active Problem List   Diagnosis Date Noted  . Back pain 06/12/2020  . HLD (hyperlipidemia) 06/12/2020  . Murmur 06/12/2020  . OSA (obstructive sleep apnea) 06/12/2020  . Moderate right ankle sprain 11/23/2019  . Bloating 01/01/2015  . Symptoms consistent with irritable bowel syndrome 01/01/2015  . ALLERGIC RHINITIS 07/18/2011  . ASTHMA 07/18/2011  . Obesity, unspecified 08/07/2010    Past Surgical History:  Procedure Laterality Date  . LEEP      OB History   No obstetric history on file.      Home Medications    Prior to Admission medications   Medication Sig Start Date End Date Taking? Authorizing Provider  hydrocortisone 2.5 % cream Apply topically 2 (two) times daily. For no longer than 1 week on the face 01/22/17   Lurene Shadow, PA-C  levofloxacin (LEVAQUIN) 500 MG tablet Take 1 tablet (500 mg total) by mouth daily. 01/28/16   Elson Areas, PA-C  mupirocin ointment (BACTROBAN) 2 % Apply to rashes 3 times daily got 5 days 01/22/17   Lurene Shadow, PA-C  phentermine 37.5 MG capsule Take 37.5 mg by mouth every morning.    [provider]    Family History Family History  Problem Relation Age of Onset  . Diabetes Paternal Grandmother   . Hypertension Paternal Grandmother   . Hypertension Maternal Grandfather   . Hypertension Maternal Grandmother   .  Hypertension Paternal Grandfather   . Healthy Mother   . Healthy Father   . Healthy Sister   . Healthy Brother   . Healthy Brother     Social History Social History   Tobacco Use  . Smoking status: Former Smoker    Quit date: 01/27/2006    Years since quitting: 14.3  . Smokeless tobacco: Never Used  Substance Use Topics  . Alcohol use: No  . Drug use: No     Allergies   Atorvastatin   Review of Systems Review of Systems  Constitutional: Negative for chills and fever.  HENT: Negative for congestion, ear pain, sore throat, trouble swallowing and voice change.   Respiratory: Negative for cough and shortness of breath.   Cardiovascular: Negative for chest pain and palpitations.  Gastrointestinal: Positive for abdominal pain. Negative for diarrhea, nausea and vomiting.  Genitourinary: Positive for flank pain (right). Negative for dysuria, frequency and hematuria.  Musculoskeletal: Negative for arthralgias, back pain and myalgias.  Skin: Negative for rash.  All other systems reviewed and are negative.    Physical Exam Triage Vital Signs ED Triage Vitals  Enc Vitals Group     BP 06/12/20 1143 130/83     Pulse Rate 06/12/20 1143 63     Resp 06/12/20 1143 17     Temp 06/12/20 1143 98.5 F (36.9 C)  Temp Source 06/12/20 1143 Oral     SpO2 06/12/20 1143 98 %     Weight 06/12/20 1154 220 lb (99.8 kg)     Height --      Head Circumference --      Peak Flow --      Pain Score 06/12/20 1148 4     Pain Loc --      Pain Edu? --      Excl. in GC? --    No data found.  Updated Vital Signs BP 130/83 (BP Location: Right Arm)   Pulse 63   Temp 98.5 F (36.9 C) (Oral)   Resp 17   Wt 220 lb (99.8 kg)   SpO2 98%   BMI 35.51 kg/m   Visual Acuity Right Eye Distance:   Left Eye Distance:   Bilateral Distance:    Right Eye Near:   Left Eye Near:    Bilateral Near:     Physical Exam Vitals and nursing note reviewed.  Constitutional:      General: She is not in  acute distress.    Appearance: Normal appearance. She is well-developed. She is not ill-appearing, toxic-appearing or diaphoretic.  HENT:     Head: Normocephalic and atraumatic.     Nose: Nose normal.     Mouth/Throat:     Mouth: Mucous membranes are moist.     Pharynx: Oropharynx is clear.  Cardiovascular:     Rate and Rhythm: Normal rate and regular rhythm.  Pulmonary:     Effort: Pulmonary effort is normal. No respiratory distress.     Breath sounds: Normal breath sounds. No stridor. No wheezing, rhonchi or rales.  Abdominal:     General: There is no distension.     Palpations: Abdomen is soft.     Tenderness: There is abdominal tenderness. There is no right CVA tenderness, left CVA tenderness, guarding or rebound. Positive signs include Murphy's sign.  Musculoskeletal:        General: Normal range of motion.     Cervical back: Normal range of motion.  Skin:    General: Skin is warm and dry.  Neurological:     Mental Status: She is alert and oriented to person, place, and time.  Psychiatric:        Behavior: Behavior normal.      UC Treatments / Results  Labs (all labs ordered are listed, but only abnormal results are displayed) Labs Reviewed  COMPLETE METABOLIC PANEL WITH GFR  LIPASE  POCT CBC W AUTO DIFF ([facility])  POCT URINALYSIS DIP (MANUAL ENTRY)    EKG   Radiology US Abdomen Limited RUQ  Result Date: 06/12/2020 CLINICAL DATA:  Right upper quadrant pain EXAM: ULTRASOUND ABDOMEN LIMITED RIGHT UPPER QUADRANT COMPARISON:  None. FINDINGS: Gallbladder: Gallbladder is distended. There are echogenic foci in the gallbladder which move and shadow consistent with cholelithiasis. Largest gallstone measures 2.4 cm in length. The gallbladder wall does not appear appreciably thickened. The patient is focally tender over this dilated gallbladder. Common bile duct: Diameter: 3 mm in visualized portion. Most of the common bile duct is obscured by gas. No intrahepatic  biliary duct dilatation is appreciable. Liver: There is an apparent cyst in the left lobe of the liver measuring 1.2 x 1.0 x 0.8 cm. No other focal liver lesion evident. Liver echogenicity overall is increased diffusely. Portal vein is patent on color Doppler imaging with normal direction of blood flow towards the liver. Other: None. IMPRESSION: 1. Distended gallbladder  with cholelithiasis and focal tenderness over the gallbladder. These findings are concerning for a degree of acute cholecystitis. 2. Only slight visualization of the biliary ductal system due to overlying gas. 3. Apparent 1.2 x 1.0 x 0.8 cm cyst in left lobe of liver. The liver echogenicity is increased diffusely consistent with hepatic steatosis and potential underlying parenchymal liver disease. Note that there are no noncystic liver lesions appreciable on this study. The sensitivity of ultrasound for detection of noncystic liver lesions is diminished significantly given this degree of diffuse increased liver echogenicity secondary to the hepatic steatosis. These results will be called to the ordering clinician or representative by the Radiologist Assistant, and communication documented in the PACS or [employer]. Electronically Signed   By: Bretta Bang III M.D.   On: 06/12/2020 12:58    Procedures Procedures (including critical care time)  Medications Ordered in UC Medications - No data to display  Initial Impression / Assessment and Plan / UC Course  I have reviewed the triage vital signs and the nursing notes.  Pertinent labs & imaging results that were available during my care of the patient were reviewed by me and considered in my medical decision making (see chart for details).    RUQ tenderness on exam.  CBC: WBC- 12.2 Hx and exam c/w cholecystitis Discussed imaging with pt Advised further workup and treatment needed at the hospital Pt declined EMS transport. Pt safe for discharge to drive herself to the  hospital. Pt plans to go to [facility] emergency department. AVS given.  Final Clinical Impressions(s) / UC Diagnoses   Final diagnoses:  RUQ abdominal pain  Right flank pain  Cholecystitis, acute     Discharge Instructions      You have declined EMS transport, however, your work up in urgent care this morning is consistent with an infection of your gallbladder known as cholecystitis.  Emergent surgery will likely be performed to remove your gallbladder to help treat the infection. Please drive directly to the emergency department for further evaluation and treatment. Do not eat or drink anything until reevaluated by another medical provider and given permission to do so.     ED Prescriptions    None     PDMP not reviewed this encounter.   Lurene Shadow, [place] 06/12/20 1326

## 2020-06-12 NOTE — ED Notes (Signed)
Patient is being discharged from the Urgent Care and sent to the Emergency Department via pov . Per Waylan Rocher, PA-C patient is in need of higher level of care due to gall bladder infection & elevated WBC (12.2). Patient is aware and verbalizes understanding of plan of care.  Vitals:   06/12/20 1143  BP: 130/83  Pulse: 63  Resp: 17  Temp: 98.5 F (36.9 C)  SpO2: 98%  Pt is driving self directly to [facility] ER.  Pt instructed not to eat or drink enroute. Attempt to call [facility] traige - no answer at this time

## 2020-06-12 NOTE — ED Provider Notes (Signed)
[facility]-EMERGENCY DEPT Provider Note   CSN: [id number] Arrival date & time: 06/12/20  1341     History Chief Complaint  Patient presents with  . Abdominal Pain    Toni Ortiz is a 39 y.o. female.  HPI She presents for evaluation of right upper quadrant pain with findings for cholecystitis on ultrasound done today, when she was evaluated at a local urgent care.  She reports constant right upper quadrant abdominal pain for 3 days, without fever, chills, nausea or vomiting.  No prior diagnosis or problems with gallbladder.  She denies cough, fever, chills, back pain, dizziness or weakness.  She has not had any Covid vaccines.  She has family history of gallbladder disease in sister at age 63.  There are no other known modifying factors.    History reviewed. No pertinent past medical history.  Patient Active Problem List   Diagnosis Date Noted  . Back pain 06/12/2020  . HLD (hyperlipidemia) 06/12/2020  . Murmur 06/12/2020  . OSA (obstructive sleep apnea) 06/12/2020  . Moderate right ankle sprain 11/23/2019  . Bloating 01/01/2015  . Symptoms consistent with irritable bowel syndrome 01/01/2015  . ALLERGIC RHINITIS 07/18/2011  . ASTHMA 07/18/2011  . Obesity, unspecified 08/07/2010    Past Surgical History:  Procedure Laterality Date  . LEEP       OB History   No obstetric history on file.     Family History  Problem Relation Age of Onset  . Diabetes Paternal Grandmother   . Hypertension Paternal Grandmother   . Hypertension Maternal Grandfather   . Hypertension Maternal Grandmother   . Hypertension Paternal Grandfather   . Healthy Mother   . Healthy Father   . Healthy Sister   . Healthy Brother   . Healthy Brother     Social History   Tobacco Use  . Smoking status: Former Smoker    Quit date: 01/27/2006    Years since quitting: 14.3  . Smokeless tobacco: Never Used  Substance Use Topics  . Alcohol use: No  . Drug use: No     Home Medications Prior to Admission medications   Medication Sig Start Date End Date Taking? Authorizing Provider  hydrocortisone 2.5 % cream Apply topically 2 (two) times daily. For no longer than 1 week on the face 01/22/17   Lurene Shadow, PA-C  levofloxacin (LEVAQUIN) 500 MG tablet Take 1 tablet (500 mg total) by mouth daily. 01/28/16   Elson Areas, PA-C  mupirocin ointment (BACTROBAN) 2 % Apply to rashes 3 times daily got 5 days 01/22/17   Lurene Shadow, PA-C  phentermine 37.5 MG capsule Take 37.5 mg by mouth every morning.    [provider]    Allergies    Atorvastatin  Review of Systems   Review of Systems  All other systems reviewed and are negative.   Physical Exam Updated Vital Signs BP 137/65 (BP Location: Left Arm)   Pulse (!) 57   Temp 98.3 F (36.8 C) (Oral)   Resp 18   SpO2 100%   Physical Exam Vitals and nursing note reviewed.  Constitutional:      General: She is not in acute distress.    Appearance: She is well-developed. She is not ill-appearing, toxic-appearing or diaphoretic.  HENT:     Head: Normocephalic and atraumatic.     Right Ear: External ear normal.     Left Ear: External ear normal.     Mouth/Throat:     Mouth:  Mucous membranes are moist.     Pharynx: No oropharyngeal exudate or posterior oropharyngeal erythema.  Eyes:     Conjunctiva/sclera: Conjunctivae normal.     Pupils: Pupils are equal, round, and reactive to light.  Neck:     Trachea: Phonation normal.  Cardiovascular:     Rate and Rhythm: Normal rate and regular rhythm.     Heart sounds: Normal heart sounds.  Pulmonary:     Effort: Pulmonary effort is normal. No respiratory distress.     Breath sounds: Normal breath sounds. No stridor.  Abdominal:     General: There is no distension.     Palpations: Abdomen is soft. There is no mass.     Tenderness: There is abdominal tenderness (Mild right upper quadrant tenderness). There is no guarding.     Hernia: No  hernia is present.  Musculoskeletal:        General: Normal range of motion.     Cervical back: Normal range of motion and neck supple.  Skin:    General: Skin is warm and dry.  Neurological:     Mental Status: She is alert and oriented to person, place, and time.     Cranial Nerves: No cranial nerve deficit.     Sensory: No sensory deficit.     Motor: No abnormal muscle tone.     Coordination: Coordination normal.  Psychiatric:        Mood and Affect: Mood normal.        Behavior: Behavior normal.        Thought Content: Thought content normal.        Judgment: Judgment normal.     ED Results / Procedures / Treatments   Labs (all labs ordered are listed, but only abnormal results are displayed) Labs Reviewed  CBC - Abnormal; Notable for the following components:      Result Value   WBC 11.9 (*)    All other components within normal limits  URINALYSIS, ROUTINE W REFLEX MICROSCOPIC - Abnormal; Notable for the following components:   Ketones, ur 5 (*)    All other components within normal limits  SARS CORONAVIRUS 2 BY RT PCR (HOSPITAL ORDER, PERFORMED IN [facility])  LIPASE, BLOOD  COMPREHENSIVE METABOLIC PANEL  I-STAT BETA HCG BLOOD, ED ([facility], [facility], [facility] ONLY)    EKG None  Radiology US Abdomen Limited RUQ  Result Date: 06/12/2020 CLINICAL DATA:  Right upper quadrant pain EXAM: ULTRASOUND ABDOMEN LIMITED RIGHT UPPER QUADRANT COMPARISON:  None. FINDINGS: Gallbladder: Gallbladder is distended. There are echogenic foci in the gallbladder which move and shadow consistent with cholelithiasis. Largest gallstone measures 2.4 cm in length. The gallbladder wall does not appear appreciably thickened. The patient is focally tender over this dilated gallbladder. Common bile duct: Diameter: 3 mm in visualized portion. Most of the common bile duct is obscured by gas. No intrahepatic biliary duct dilatation is appreciable. Liver: There is an apparent cyst in the left lobe of the  liver measuring 1.2 x 1.0 x 0.8 cm. No other focal liver lesion evident. Liver echogenicity overall is increased diffusely. Portal vein is patent on color Doppler imaging with normal direction of blood flow towards the liver. Other: None. IMPRESSION: 1. Distended gallbladder with cholelithiasis and focal tenderness over the gallbladder. These findings are concerning for a degree of acute cholecystitis. 2. Only slight visualization of the biliary ductal system due to overlying gas. 3. Apparent 1.2 x 1.0 x 0.8 cm cyst in left lobe of liver.  The liver echogenicity is increased diffusely consistent with hepatic steatosis and potential underlying parenchymal liver disease. Note that there are no noncystic liver lesions appreciable on this study. The sensitivity of ultrasound for detection of noncystic liver lesions is diminished significantly given this degree of diffuse increased liver echogenicity secondary to the hepatic steatosis. These results will be called to the ordering clinician or representative by the Radiologist Assistant, and communication documented in the PACS or [employer]. Electronically Signed   By: Bretta Bang III M.D.   On: 06/12/2020 12:58    Procedures Procedures (including critical care time)  Medications Ordered in ED Medications - No data to display  ED Course  I have reviewed the triage vital signs and the nursing notes.  Pertinent labs & imaging results that were available during my care of the patient were reviewed by me and considered in my medical decision making (see chart for details).  Clinical Course as of Jun 13 1827  Thu Jun 12, 2020  1755 Normal  I-Stat beta hCG blood, ED [EW]  1756 Normal   [EW]  1757 Normal except white count high  CBC(!) [EW]  1757 Normal  Comprehensive metabolic panel [EW]  1825 Case discussed with general surgery, Dr. Magnus Ivan who will see and admit the patient.   [EW]    Clinical Course User Index [EW] Mancel Bale, MD    MDM Rules/Calculators/A&P                           Patient Vitals for the past 24 hrs:  BP Temp Temp src Pulse Resp SpO2  06/12/20 1624 137/65 -- -- (!) 57 18 100 %  06/12/20 1411 129/76 98.3 F (36.8 C) Oral 68 16 98 %    6:27 PM Reevaluation with update and discussion. After initial assessment and treatment, an updated evaluation reveals she is comfortable.  She is hungry and wants to eat.  Findings discussed with the patient and all questions were answered. Mancel Bale   Medical Decision Making:  This patient is presenting for evaluation of abdominal pain and cholecystitis, which does require a range of treatment options, and is a complaint that involves a high risk of morbidity and mortality. The differential diagnoses include gallstones, cholecystitis, nonspecific abdominal pain. I decided to review old records, and in summary patient presenting for constant right upper quadrant pain which is mild to moderate, without other associated problems.  She was diagnosed with cholecystitis today on ultrasound imaging.  I did not require additional historical information from anyone.  Clinical Laboratory Tests Ordered, included CBC, Metabolic panel and Lipase, pregnancy test. Review indicates normal except elevated white count.    Critical Interventions-clinical evaluation, laboratory testing, observation reassessment  After These Interventions, the Patient was reevaluated and was found with gallstones and cholecystitis indicated by pain during ultrasound imaging.  Mild white count elevation without fever.  Patient's pain is not severe.  She does not have colicky component to her pain and it has been constant for 3 days.  CRITICAL CARE- no Performed by: Mancel Bale  Nursing Notes Reviewed/ Care Coordinated Applicable Imaging Reviewed Interpretation of Laboratory Data incorporated into ED treatment  Plan: Admit per General Surgery    Final Clinical Impression(s) / ED  Diagnoses Final diagnoses:  Cholecystitis    Rx / DC Orders ED Discharge Orders    None       Mancel Bale, MD 06/12/20 [phone]

## 2020-06-13 ENCOUNTER — Encounter (HOSPITAL_COMMUNITY)
Admission: EM | Disposition: A | Discharge status: Home / Self Care | Payer: Self-pay | Source: Home / Self Care | Attending: Emergency Medicine

## 2020-06-13 ENCOUNTER — Observation Stay (HOSPITAL_COMMUNITY): Payer: BC Managed Care – PPO | Admitting: Anesthesiology

## 2020-06-13 ENCOUNTER — Encounter (HOSPITAL_COMMUNITY): Payer: Self-pay | Admitting: Surgery

## 2020-06-13 HISTORY — PX: CHOLECYSTECTOMY: SHX55

## 2020-06-13 LAB — CBC
HCT: 35.7 % — ABNORMAL LOW (ref 36.0–46.0)
Hemoglobin: 12 g/dL (ref 12.0–15.0)
MCH: 30.6 pg (ref 26.0–34.0)
MCHC: 33.6 g/dL (ref 30.0–36.0)
MCV: 91.1 fL (ref 80.0–100.0)
Platelets: 285 10*3/uL (ref 150–400)
RBC: 3.92 MIL/uL (ref 3.87–5.11)
RDW: 13.1 % (ref 11.5–15.5)
WBC: 10.8 10*3/uL — ABNORMAL HIGH (ref 4.0–10.5)
nRBC: 0 % (ref 0.0–0.2)

## 2020-06-13 LAB — HIV ANTIBODY (ROUTINE TESTING W REFLEX): HIV Screen 4th Generation wRfx: NONREACTIVE

## 2020-06-13 SURGERY — LAPAROSCOPIC CHOLECYSTECTOMY WITH INTRAOPERATIVE CHOLANGIOGRAM
Anesthesia: General | Site: Abdomen

## 2020-06-13 MED ORDER — LACTATED RINGERS IV SOLN
INTRAVENOUS | Status: AC | PRN
  Administered 2020-06-13 (×2): 1000 mL

## 2020-06-13 MED ORDER — FENTANYL CITRATE (PF) 250 MCG/5ML IJ SOLN
INTRAMUSCULAR | Status: AC
  Filled 2020-06-13: qty 5

## 2020-06-13 MED ORDER — ONDANSETRON HCL 4 MG/2ML IJ SOLN
INTRAMUSCULAR | Status: DC | PRN
  Administered 2020-06-13: 4 mg via INTRAVENOUS

## 2020-06-13 MED ORDER — PROPOFOL 10 MG/ML IV BOLUS
INTRAVENOUS | Status: DC | PRN
  Administered 2020-06-13: 200 mg via INTRAVENOUS

## 2020-06-13 MED ORDER — DEXAMETHASONE SODIUM PHOSPHATE 10 MG/ML IJ SOLN
INTRAMUSCULAR | Status: AC
  Filled 2020-06-13: qty 1

## 2020-06-13 MED ORDER — BUPIVACAINE-EPINEPHRINE (PF) 0.25% -1:200000 IJ SOLN
INTRAMUSCULAR | Status: AC
  Filled 2020-06-13: qty 30

## 2020-06-13 MED ORDER — DEXAMETHASONE SODIUM PHOSPHATE 10 MG/ML IJ SOLN
INTRAMUSCULAR | Status: DC | PRN
  Administered 2020-06-13: 10 mg via INTRAVENOUS

## 2020-06-13 MED ORDER — OXYCODONE HCL 5 MG/5ML PO SOLN: 5.0000 mg | Freq: Once | ORAL | Status: DC | PRN

## 2020-06-13 MED ORDER — ACETAMINOPHEN 500 MG PO TABS: 1000.0000 mg | ORAL_TABLET | Freq: Once | ORAL | Status: DC | PRN

## 2020-06-13 MED ORDER — PROPOFOL 10 MG/ML IV BOLUS
INTRAVENOUS | Status: AC
  Filled 2020-06-13: qty 20

## 2020-06-13 MED ORDER — OXYCODONE HCL 5 MG PO TABS: 5.0000 mg | ORAL_TABLET | Freq: Four times a day (QID) | ORAL | 0 refills | Status: AC | PRN

## 2020-06-13 MED ORDER — SUGAMMADEX SODIUM 200 MG/2ML IV SOLN
INTRAVENOUS | Status: DC | PRN
  Administered 2020-06-13: 200 mg via INTRAVENOUS

## 2020-06-13 MED ORDER — ACETAMINOPHEN 10 MG/ML IV SOLN: 1000.0000 mg | Freq: Once | INTRAVENOUS | Status: DC | PRN

## 2020-06-13 MED ORDER — LIDOCAINE 2% (20 MG/ML) 5 ML SYRINGE
INTRAMUSCULAR | Status: DC | PRN
  Administered 2020-06-13: 100 mg via INTRAVENOUS

## 2020-06-13 MED ORDER — ONDANSETRON HCL 4 MG/2ML IJ SOLN
INTRAMUSCULAR | Status: AC
  Filled 2020-06-13: qty 2

## 2020-06-13 MED ORDER — FENTANYL CITRATE (PF) 100 MCG/2ML IJ SOLN
INTRAMUSCULAR | Status: DC | PRN
Start: 2020-06-13 — End: 2020-06-13
  Administered 2020-06-13 (×2): 50 ug via INTRAVENOUS
  Administered 2020-06-13: 100 ug via INTRAVENOUS
  Administered 2020-06-13: 50 ug via INTRAVENOUS
  Administered 2020-06-13: 100 ug via INTRAVENOUS
  Administered 2020-06-13: 50 ug via INTRAVENOUS

## 2020-06-13 MED ORDER — ACETAMINOPHEN 160 MG/5ML PO SOLN: 1000.0000 mg | Freq: Once | ORAL | Status: DC | PRN

## 2020-06-13 MED ORDER — SODIUM CHLORIDE (PF) 0.9 % IJ SOLN
INTRAMUSCULAR | Status: AC
  Filled 2020-06-13: qty 50

## 2020-06-13 MED ORDER — MIDAZOLAM HCL 2 MG/2ML IJ SOLN
INTRAMUSCULAR | Status: AC
  Filled 2020-06-13: qty 2

## 2020-06-13 MED ORDER — BUPIVACAINE-EPINEPHRINE (PF) 0.25% -1:200000 IJ SOLN
INTRAMUSCULAR | Status: DC | PRN
  Administered 2020-06-13: 18 mL

## 2020-06-13 MED ORDER — ACETAMINOPHEN 325 MG PO TABS: 650.0000 mg | ORAL_TABLET | Freq: Four times a day (QID) | ORAL | Status: AC | PRN

## 2020-06-13 MED ORDER — FENTANYL CITRATE (PF) 100 MCG/2ML IJ SOLN: 25.0000 ug | INTRAMUSCULAR | Status: DC | PRN

## 2020-06-13 MED ORDER — TRAMADOL HCL 50 MG PO TABS: 50.0000 mg | ORAL_TABLET | Freq: Four times a day (QID) | ORAL | Status: DC | PRN

## 2020-06-13 MED ORDER — LACTATED RINGERS IV SOLN: INTRAVENOUS | Status: DC | PRN

## 2020-06-13 MED ORDER — OXYCODONE HCL 5 MG PO TABS: 5.0000 mg | ORAL_TABLET | Freq: Once | ORAL | Status: DC | PRN

## 2020-06-13 MED ORDER — 0.9 % SODIUM CHLORIDE (POUR BTL) OPTIME
TOPICAL | Status: DC | PRN
  Administered 2020-06-13: 1000 mL

## 2020-06-13 MED ORDER — MIDAZOLAM HCL 5 MG/5ML IJ SOLN
INTRAMUSCULAR | Status: DC | PRN
  Administered 2020-06-13: 2 mg via INTRAVENOUS

## 2020-06-13 MED ORDER — ONDANSETRON HCL 4 MG/2ML IJ SOLN: 4.0000 mg | INTRAMUSCULAR | Status: DC | PRN

## 2020-06-13 MED ORDER — ROCURONIUM BROMIDE 10 MG/ML (PF) SYRINGE
PREFILLED_SYRINGE | INTRAVENOUS | Status: DC | PRN
  Administered 2020-06-13: 50 mg via INTRAVENOUS

## 2020-06-13 SURGICAL SUPPLY — 39 items
ADH SKN CLS APL DERMABOND .7 (GAUZE/BANDAGES/DRESSINGS) ×1
APL PRP STRL LF DISP 70% ISPRP (MISCELLANEOUS) ×1
APPLIER CLIP 5 13 M/L LIGAMAX5 (MISCELLANEOUS)
APR CLP MED LRG 5 ANG JAW (MISCELLANEOUS)
BAG SPEC RTRVL 10 TROC 200 (ENDOMECHANICALS) ×1
CABLE HIGH FREQUENCY MONO STRZ (ELECTRODE) ×3 IMPLANT
CHLORAPREP W/TINT 26 (MISCELLANEOUS) ×3 IMPLANT
CLIP APPLIE 5 13 M/L LIGAMAX5 (MISCELLANEOUS) IMPLANT
CLIP VESOLOCK MED LG 6/CT (CLIP) ×5 IMPLANT
COVER MAYO STAND STRL (DRAPES) ×3 IMPLANT
COVER TRANSDUCER ULTRASND (DRAPES) IMPLANT
COVER WAND RF STERILE (DRAPES) IMPLANT
DECANTER SPIKE VIAL GLASS SM (MISCELLANEOUS) ×3 IMPLANT
DERMABOND ADVANCED (GAUZE/BANDAGES/DRESSINGS) ×2
DERMABOND ADVANCED .7 DNX12 (GAUZE/BANDAGES/DRESSINGS) ×1 IMPLANT
DRAPE C-ARM 42X120 X-RAY (DRAPES) ×3 IMPLANT
ELECT REM PT RETURN 15FT ADLT (MISCELLANEOUS) ×3 IMPLANT
GAUZE SPONGE 2X2 8PLY STRL LF (GAUZE/BANDAGES/DRESSINGS) ×1 IMPLANT
GLOVE BIO SURGEON STRL SZ7.5 (GLOVE) ×3 IMPLANT
GOWN STRL REUS W/TWL XL LVL3 (GOWN DISPOSABLE) ×9 IMPLANT
GRASPER SUT TROCAR 14GX15 (MISCELLANEOUS) IMPLANT
KIT BASIN OR (CUSTOM PROCEDURE TRAY) ×3 IMPLANT
KIT TURNOVER KIT A (KITS) IMPLANT
NDL INSUFFLATION 14GA 120MM (NEEDLE) ×1 IMPLANT
NEEDLE INSUFFLATION 14GA 120MM (NEEDLE) ×3 IMPLANT
PENCIL SMOKE EVACUATOR (MISCELLANEOUS) IMPLANT
POUCH RETRIEVAL ECOSAC 10 (ENDOMECHANICALS) IMPLANT
POUCH RETRIEVAL ECOSAC 10MM (ENDOMECHANICALS) ×3
SCISSORS LAP 5X35 DISP (ENDOMECHANICALS) ×3 IMPLANT
SET CHOLANGIOGRAPH MIX (MISCELLANEOUS) ×3 IMPLANT
SET IRRIG TUBING LAPAROSCOPIC (IRRIGATION / IRRIGATOR) ×3 IMPLANT
SET TUBE SMOKE EVAC HIGH FLOW (TUBING) ×3 IMPLANT
SLEEVE XCEL OPT CAN 5 100 (ENDOMECHANICALS) ×5 IMPLANT
SPONGE GAUZE 2X2 STER 10/PKG (GAUZE/BANDAGES/DRESSINGS) ×2
SUT MNCRL AB 4-0 PS2 18 (SUTURE) ×3 IMPLANT
TOWEL OR 17X26 10 PK STRL BLUE (TOWEL DISPOSABLE) ×3 IMPLANT
TRAY LAPAROSCOPIC (CUSTOM PROCEDURE TRAY) ×3 IMPLANT
TROCAR BLADELESS OPT 5 100 (ENDOMECHANICALS) ×3 IMPLANT
TROCAR XCEL NON-BLD 11X100MML (ENDOMECHANICALS) ×3 IMPLANT

## 2020-06-13 NOTE — Anesthesia Procedure Notes (Signed)
Procedure Name: Intubation Date/Time: 06/13/2020 12:04 PM Performed by: Vanessa Adair, CRNA Pre-anesthesia Checklist: Patient identified, Emergency Drugs available, Suction available and Patient being monitored Patient Re-evaluated:Patient Re-evaluated prior to induction Oxygen Delivery Method: Circle system utilized Preoxygenation: Pre-oxygenation with 100% oxygen Induction Type: IV induction Ventilation: Mask ventilation without difficulty Laryngoscope Size: 2 and Miller Grade View: Grade I Tube type: Oral Tube size: 7.0 mm Number of attempts: 1 Airway Equipment and Method: Stylet Placement Confirmation: ETT inserted through vocal cords under direct vision,  positive ETCO2 and breath sounds checked- equal and bilateral Secured at: 21 cm Tube secured with: Tape Dental Injury: Teeth and Oropharynx as per pre-operative assessment

## 2020-06-13 NOTE — Discharge Summary (Signed)
[facility] Discharge Summary   Patient ID: Toni Ortiz MRN: [id number] DOB/AGE: 39-Mar-1982 39 y.o.  Admit date: 06/12/2020 Discharge date: 06/13/2020  Admitting Diagnosis: Acute cholecystitis   Discharge Diagnosis Acute cholecystitis   Consultants None   Imaging: US Abdomen Limited RUQ  Result Date: 06/12/2020 CLINICAL DATA:  Right upper quadrant pain EXAM: ULTRASOUND ABDOMEN LIMITED RIGHT UPPER QUADRANT COMPARISON:  None. FINDINGS: Gallbladder: Gallbladder is distended. There are echogenic foci in the gallbladder which move and shadow consistent with cholelithiasis. Largest gallstone measures 2.4 cm in length. The gallbladder wall does not appear appreciably thickened. The patient is focally tender over this dilated gallbladder. Common bile duct: Diameter: 3 mm in visualized portion. Most of the common bile duct is obscured by gas. No intrahepatic biliary duct dilatation is appreciable. Liver: There is an apparent cyst in the left lobe of the liver measuring 1.2 x 1.0 x 0.8 cm. No other focal liver lesion evident. Liver echogenicity overall is increased diffusely. Portal vein is patent on color Doppler imaging with normal direction of blood flow towards the liver. Other: None. IMPRESSION: 1. Distended gallbladder with cholelithiasis and focal tenderness over the gallbladder. These findings are concerning for a degree of acute cholecystitis. 2. Only slight visualization of the biliary ductal system due to overlying gas. 3. Apparent 1.2 x 1.0 x 0.8 cm cyst in left lobe of liver. The liver echogenicity is increased diffusely consistent with hepatic steatosis and potential underlying parenchymal liver disease. Note that there are no noncystic liver lesions appreciable on this study. The sensitivity of ultrasound for detection of noncystic liver lesions is diminished significantly given this degree of diffuse increased liver echogenicity secondary to the hepatic steatosis. These  results will be called to the ordering clinician or representative by the Radiologist Assistant, and communication documented in the PACS or [employer]. Electronically Signed   By: Bretta Bang III M.D.   On: 06/12/2020 12:58    Procedures Dr. Derrell Lolling (06/13/20) - Laparoscopic Cholecystectomy   Hospital Course:  Patient is a 39 year old female who [facility] with RUQ pain.  Workup showed cholelithiasis and possible cholecystitis.  Patient was admitted and underwent procedure listed above.  Tolerated procedure well and was transferred to the floor.  Diet was advanced as tolerated.  On POD#0, the patient was voiding well, tolerating diet, ambulating well, pain well controlled, vital signs stable, incisions c/d/i and felt stable for discharge home.  Patient will follow up in our office in 3 weeks and knows to call with questions or concerns. She will call to confirm appointment date/time.    Physical Exam: General:  Alert, NAD, pleasant, comfortable Abd:  Soft, ND, mild tenderness, incisions C/D/I  I or a member of my team have reviewed this patient in the Controlled Substance Database.   Allergies as of 06/13/2020      Reactions   Atorvastatin Other (See Comments)      Medication List    TAKE these medications   acetaminophen 325 MG tablet Commonly known as: TYLENOL Take 2 tablets (650 mg total) by mouth every 6 (six) hours as needed for mild pain (or temp > 100).   oxyCODONE 5 MG immediate release tablet Commonly known as: Oxy IR/ROXICODONE Take 1-2 tablets (5-10 mg total) by mouth every 6 (six) hours as needed for moderate pain or severe pain.         Follow-up Information    [facility], [place]. Go on 07/01/2020.   Specialty: General Surgery  Why: Your appointment is 9/14 at 10:15am Please arrive 30 minutes prior to your appointment to check in and fill out paperwork. Bring photo ID and insurance information. Contact information: [address] [place] [address] [phone]              Signed: Juliet Rude , [facility] 06/13/2020, 3:36 PM Please see Amion for pager number during day hours 7:00am-4:30pm

## 2020-06-13 NOTE — Anesthesia Postprocedure Evaluation (Signed)
Anesthesia Post Note  Patient: Anaysha Andre Chuba  Procedure(s) Performed: LAPAROSCOPIC CHOLECYSTECTOMY (N/A Abdomen)     Patient location during evaluation: PACU Anesthesia Type: General Level of consciousness: awake Pain management: pain level controlled Vital Signs Assessment: post-procedure vital signs reviewed and stable Respiratory status: spontaneous breathing, nonlabored ventilation, respiratory function stable and patient connected to nasal cannula oxygen Cardiovascular status: blood pressure returned to baseline and stable Postop Assessment: no apparent nausea or vomiting Anesthetic complications: no   No complications documented.  Last Vitals:  Vitals:   06/13/20 1400 06/13/20 1420  BP: 132/69 139/76  Pulse: (!) 49 (!) 57  Resp: 11 18  Temp: 36.9 C 36.7 C  SpO2: 100% 99%    Last Pain:  Vitals:   06/13/20 1420  TempSrc: Oral  PainSc: 9                  Maymuna Detzel P Jillane Po

## 2020-06-13 NOTE — Transfer of Care (Signed)
Immediate Anesthesia Transfer of Care Note  Patient: Toni Ortiz  Procedure(s) Performed: LAPAROSCOPIC CHOLECYSTECTOMY (N/A Abdomen)  Patient Location: PACU  Anesthesia Type:General  Level of Consciousness: awake, alert , oriented and patient cooperative  Airway & Oxygen Therapy: Patient Spontanous Breathing and Patient connected to face mask oxygen  Post-op Assessment: Report given to RN, Post -op Vital signs reviewed and stable and Patient moving all extremities X 4  Post vital signs: stable  Last Vitals:  Vitals Value Taken Time  BP 144/84 06/13/20 1300  Temp 36.6 C 06/13/20 1256  Pulse 44 06/13/20 1305  Resp 9 06/13/20 1305  SpO2 100 % 06/13/20 1305  Vitals shown include unvalidated device data.  Last Pain:  Vitals:   06/13/20 1043  TempSrc: Oral  PainSc: 7          Complications: No complications documented.

## 2020-06-13 NOTE — Anesthesia Preprocedure Evaluation (Signed)
Anesthesia Evaluation  Patient identified by MRN, date of birth, ID band Patient awake    Reviewed: Allergy & Precautions, NPO status , Patient's Chart, lab work & pertinent test results  History of Anesthesia Complications Negative for: history of anesthetic complications  Airway Mallampati: II  TM Distance: >3 FB Neck ROM: Full    Dental  (+) Dental Advisory Given, Teeth Intact   Pulmonary sleep apnea , neg recent URI, former smoker,  Covid-19 Nucleic Acid Test Results Lab Results      Component                Value               Date                      SARSCOV2NAA              NEGATIVE            06/12/2020              breath sounds clear to auscultation       Cardiovascular negative cardio ROS   Rhythm:Regular     Neuro/Psych negative neurological ROS  negative psych ROS   GI/Hepatic Neg liver ROS, ACUTE CHOLECYSTITIS   Endo/Other  negative endocrine ROS  Renal/GU negative Renal ROS     Musculoskeletal negative musculoskeletal ROS (+)   Abdominal   Peds  Hematology negative hematology ROS (+) Lab Results      Component                Value               Date                      WBC                      10.8 (H)            06/13/2020                HGB                      12.0                06/13/2020                HCT                      35.7 (L)            06/13/2020                MCV                      91.1                06/13/2020                PLT                      285                 06/13/2020              Anesthesia Other Findings   Reproductive/Obstetrics  Anesthesia Physical Anesthesia Plan  ASA: II  Anesthesia Plan: General   Post-op Pain Management:    Induction: Intravenous  PONV Risk Score and Plan: 3 and Ondansetron and Dexamethasone  Airway Management Planned: Oral ETT  Additional Equipment: None  Intra-op  Plan:   Post-operative Plan: Extubation in OR  Informed Consent: I have reviewed the patients History and Physical, chart, labs and discussed the procedure including the risks, benefits and alternatives for the proposed anesthesia with the patient or authorized representative who has indicated his/her understanding and acceptance.     Dental advisory given  Plan Discussed with: CRNA  Anesthesia Plan Comments:         Anesthesia Quick Evaluation

## 2020-06-13 NOTE — Progress Notes (Signed)
D/c instructions given to patient. Patient had no questions. NT or writer will wheel patient out once she is dressed.  

## 2020-06-13 NOTE — Interval H&P Note (Signed)
History and Physical Interval Note:  06/13/2020 8:13 AM  Toni Ortiz  has presented today for surgery, with the diagnosis of ACUTE CHOLECYSTITIS.  The various methods of treatment have been discussed with the patient and family. After consideration of risks, benefits and other options for treatment, the patient has consented to  Procedure(s): LAPAROSCOPIC CHOLECYSTECTOMY WITH INTRAOPERATIVE CHOLANGIOGRAM (N/A) as a surgical intervention.  The patient's history has been reviewed, patient examined, no change in status, stable for surgery.  I have reviewed the patient's chart and labs.  Questions were answered to the patient's satisfaction.     Axel Filler

## 2020-06-13 NOTE — Discharge Instructions (Signed)
[facility] ° °Please arrive at least 30 min before your appointment to complete your check in paperwork.  If you are unable to arrive 30 min prior to your appointment time we may have to cancel or reschedule you. °LAPAROSCOPIC SURGERY: POST OP INSTRUCTIONS °Always review your discharge instruction sheet given to you by the facility where your surgery was performed. °IF YOU HAVE DISABILITY OR FAMILY LEAVE FORMS, YOU MUST BRING THEM TO THE OFFICE FOR PROCESSING.   °DO NOT GIVE THEM TO YOUR DOCTOR. ° °PAIN CONTROL ° °1. First take acetaminophen (Tylenol) AND/or ibuprofen (Advil) to control your pain after surgery.  Follow directions on package.  Taking acetaminophen (Tylenol) and/or ibuprofen (Advil) regularly after surgery will help to control your pain and lower the amount of prescription pain medication you may need.  You should not take more than 4,000 mg (4 grams) of acetaminophen (Tylenol) in 24 hours.  You should not take ibuprofen (Advil), aleve, motrin, naprosyn or other NSAIDS if you have a history of stomach ulcers or chronic kidney disease.  °2. A prescription for pain medication may be given to you upon discharge.  Take your pain medication as prescribed, if you still have uncontrolled pain after taking acetaminophen (Tylenol) or ibuprofen (Advil). °3. Use ice packs to help control pain. °4. If you need a refill on your pain medication, please contact your pharmacy.  They will contact our office to request authorization. Prescriptions will not be filled after 5pm or on week-ends. ° °HOME MEDICATIONS °5. Take your usually prescribed medications unless otherwise directed. ° °DIET °6. You should follow a light diet the first few days after arrival home.  Be sure to include lots of fluids daily. Avoid fatty, fried foods.  ° °CONSTIPATION °7. It is common to experience some constipation after surgery and if you are taking pain medication.  Increasing fluid intake and taking a stool  softener (such as Colace) will usually help or prevent this problem from occurring.  A mild laxative (Milk of Magnesia or Miralax) should be taken according to package instructions if there are no bowel movements after 48 hours. ° °WOUND/INCISION CARE °8. Most patients will experience some swelling and bruising in the area of the incisions.  Ice packs will help.  Swelling and bruising can take several days to resolve.  °9. Unless discharge instructions indicate otherwise, follow guidelines below  °a. STERI-STRIPS - you may remove your outer bandages 48 hours after surgery, and you may shower at that time.  You have steri-strips (small skin tapes) in place directly over the incision.  These strips should be left on the skin for 7-10 days.   °b. DERMABOND/SKIN GLUE - you may shower in 24 hours.  The glue will flake off over the next 2-3 weeks. °10. Any sutures or staples will be removed at the office during your follow-up visit. ° °ACTIVITIES °11. You may resume regular (light) daily activities beginning the next day--such as daily self-care, walking, climbing stairs--gradually increasing activities as tolerated.  You may have sexual intercourse when it is comfortable.  Refrain from any heavy lifting or straining until approved by your doctor. °a. You may drive when you are no longer taking prescription pain medication, you can comfortably wear a seatbelt, and you can safely maneuver your car and apply brakes. ° °FOLLOW-UP °12. You should see your doctor in the office for a follow-up appointment approximately 2-3 weeks after your surgery.  You should have been given your post-op/follow-up appointment when   your surgery was scheduled.  If you did not receive a post-op/follow-up appointment, make sure that you call for this appointment within a day or two after you arrive home to insure a convenient appointment time. ° °OTHER INSTRUCTIONS ° °WHEN TO CALL YOUR DOCTOR: °1. Fever over 101.0 °2. Inability to  urinate °3. Continued bleeding from incision. °4. Increased pain, redness, or drainage from the incision. °5. Increasing abdominal pain ° °The clinic staff is available to answer your questions during regular business hours.  Please don’t hesitate to call and ask to speak to one of the nurses for clinical concerns.  If you have a medical emergency, go to the nearest emergency room or call 911.  A surgeon from [facility] is always on call at the hospital. °[address], [address], [place], [place]  [address] ? [address] [address], [place], [place]   [address] °[phone] ? [phone] ? FAX [phone] ° ° ° °

## 2020-06-13 NOTE — Op Note (Signed)
06/13/2020  12:37 PM  PATIENT:  Toni Ortiz  39 y.o. female  PRE-OPERATIVE DIAGNOSIS:  ACUTE CHOLECYSTITIS  POST-OPERATIVE DIAGNOSIS:  ACUTE CHOLECYSTITIS  PROCEDURE:  Procedure(s): LAPAROSCOPIC CHOLECYSTECTOMY (N/A)  SURGEON:  Surgeon(s) and Role:    Axel Filler, MD - Primary  PHYSICIAN ASSISTANT: Trixie Deis PA-C, who was essential at assisting with retraction, and closure of the trochar sites.  ASSISTANTS: none   ANESTHESIA:   local and general  EBL:  minimal   BLOOD ADMINISTERED:none  DRAINS: none   LOCAL MEDICATIONS USED:  BUPIVICAINE   SPECIMEN:  Source of Specimen:  gallbladder  DISPOSITION OF SPECIMEN:  PATHOLOGY  COUNTS:  YES  TOURNIQUET:  * No tourniquets in log *  DICTATION: .Dragon Dictation The patient was taken to the operating and placed in the supine position with bilateral SCDs in place.  The patient was prepped and draped in the usual sterile fashion. A time out was called and all facts were verified. A pneumoperitoneum was obtained via A Veress needle technique to a pressure of 55mm of mercury.  A 10mm trochar was then placed in the right upper quadrant under visualization, and there were no injuries to any abdominal organs. A 11 mm port was then placed in the umbilical region after infiltrating with local anesthesia under direct visualization. A second and third epigastric port and right lower quadrant port placement under direct visualization, respectively.    The gallbladder was identified and retracted, the peritoneum was then sharply dissected from the gallbladder and this dissection was carried down to Calot's triangle. The gallbladder was identified and stripped away circumferentially and seen going into the gallbladder 360, the critical angle was obtained.  2 clips were placed proximally one distally and the cystic duct transected. The cystic artery was identified and 2 clips placed proximally and one distally and transected.  We  then proceeded to remove the gallbladder off the hepatic fossa with Bovie cautery. A retrieval bag was then placed in the abdomen and gallbladder placed in the bag. The hepatic fossa was then reexamined and hemostasis was achieved with Bovie cautery and was excellent at the end of the case.   The subhepatic fossa and perihepatic fossa was then irrigated until the effluent was clear.  The gallbladder and bag were removed from the abdominal cavity. The 11 mm trocar fascia was reapproximated with the Endo Close #1 Vicryl x2.  The pneumoperitoneum was evacuated and all trochars removed under direct visulalization.  The skin was then closed with 4-0 Monocryl and the skin dressed with Dermabond.    The patient was awaken from general anesthesia and taken to the recovery room in stable condition.   PLAN OF CARE: Discharge to home after PACU  PATIENT DISPOSITION:  PACU - hemodynamically stable.   Delay start of Pharmacological VTE agent (>24hrs) due to surgical blood loss or risk of bleeding: not applicable

## 2020-06-14 ENCOUNTER — Encounter (HOSPITAL_COMMUNITY): Payer: Self-pay | Admitting: General Surgery

## 2020-06-16 LAB — SURGICAL PATHOLOGY

## 2021-08-10 IMAGING — US US ABDOMEN LIMITED
1 series · 13 of 25 positions shown · non-contrast
Comparison: None.

CLINICAL DATA: Right upper quadrant pain

EXAM:
ULTRASOUND ABDOMEN LIMITED RIGHT UPPER QUADRANT

[Series 1: us abdomen limited · 0.13mm/px · 13 of 82 slices shown]
[im 1/82]
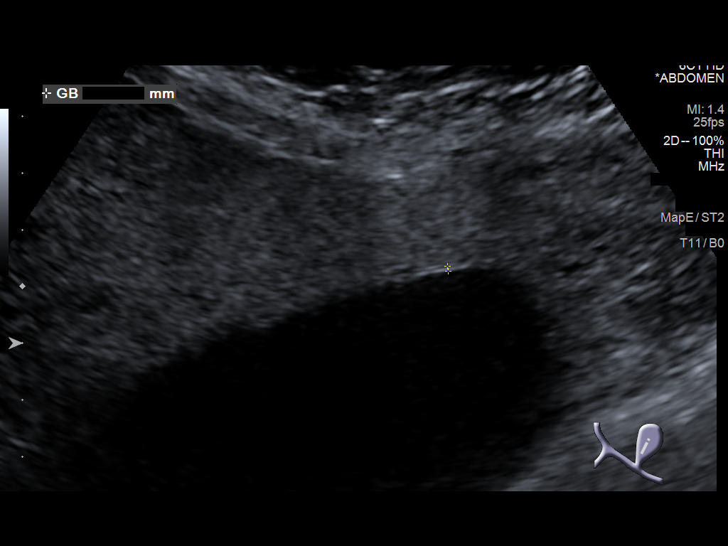
[im 7/82]
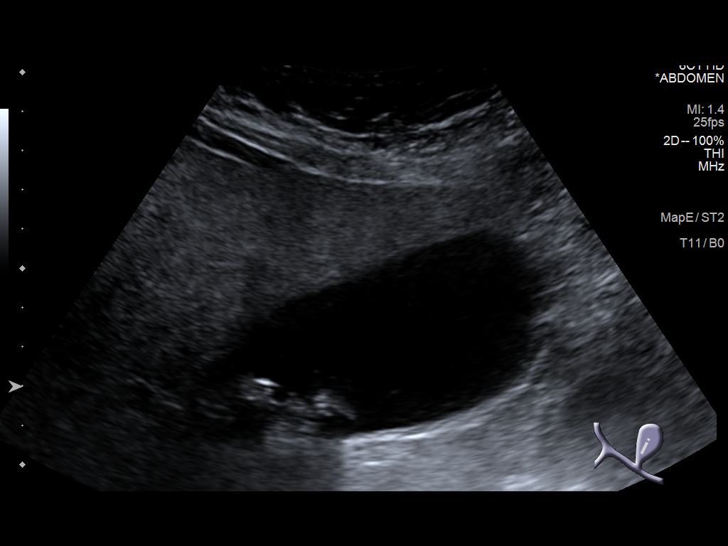
[im 14/82]
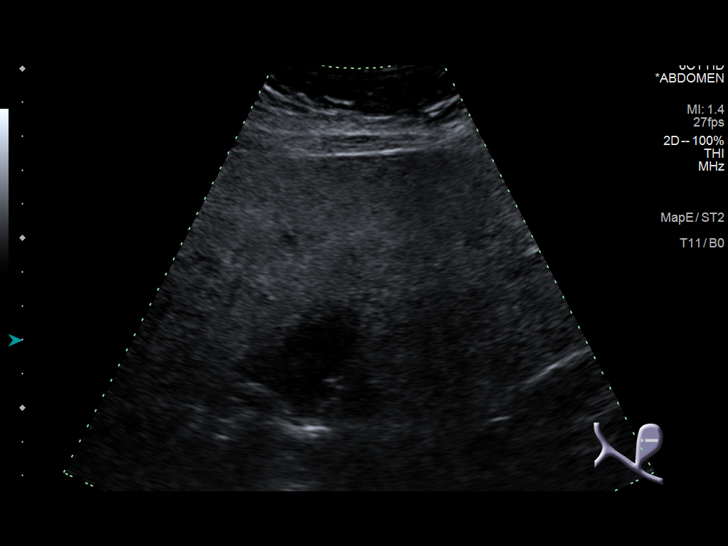
[im 21/82]
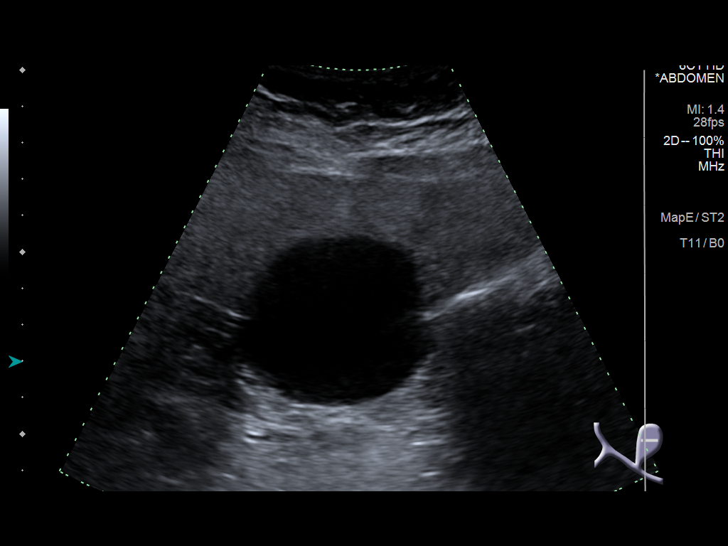
[im 28/82]
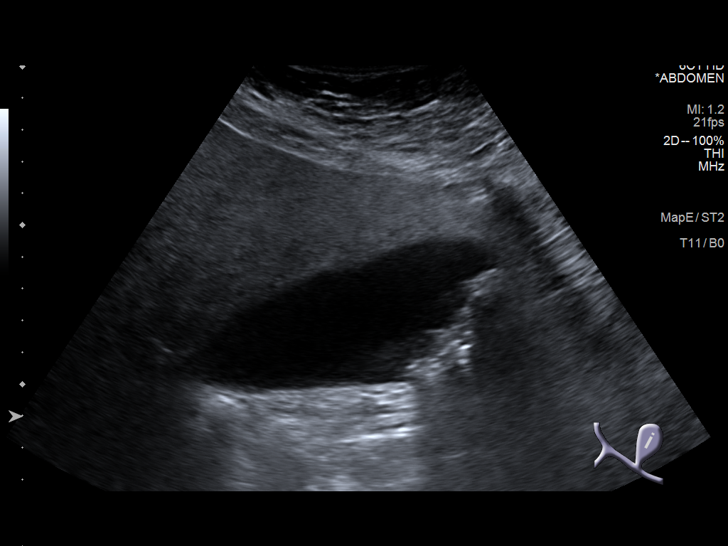
[im 34/82]
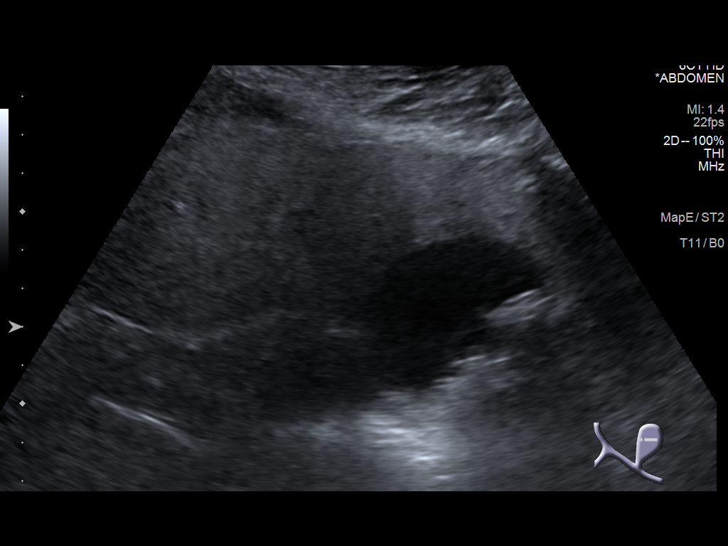
[im 41/82]
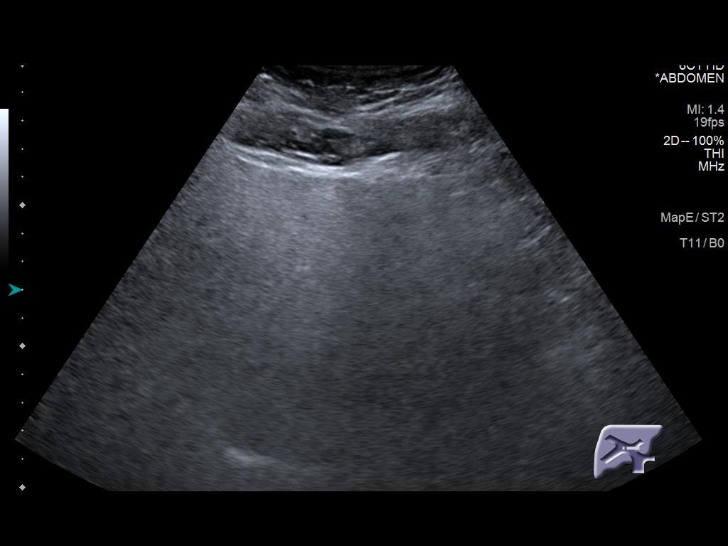
[im 48/82]
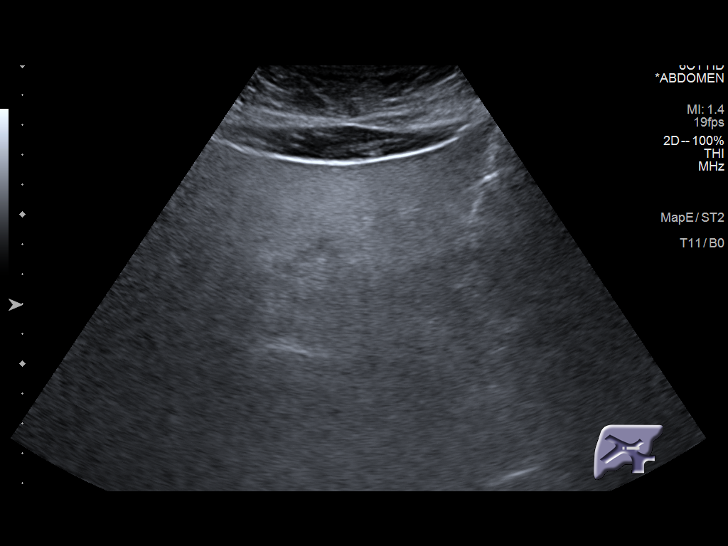
[im 55/82]
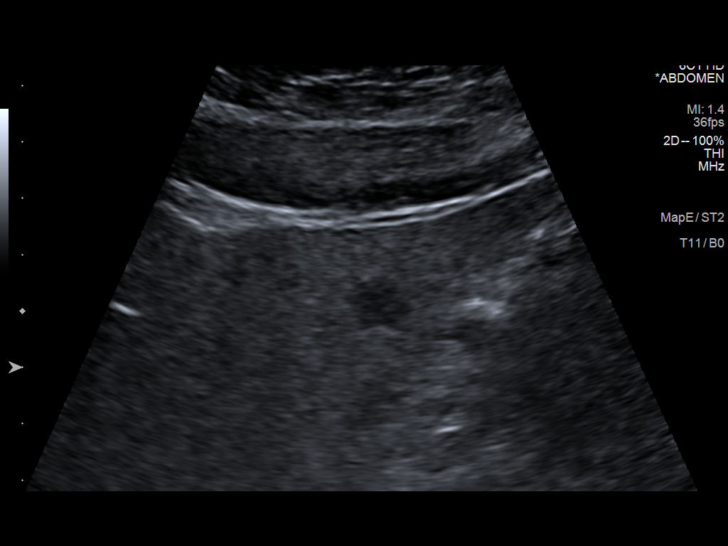
[im 61/82]
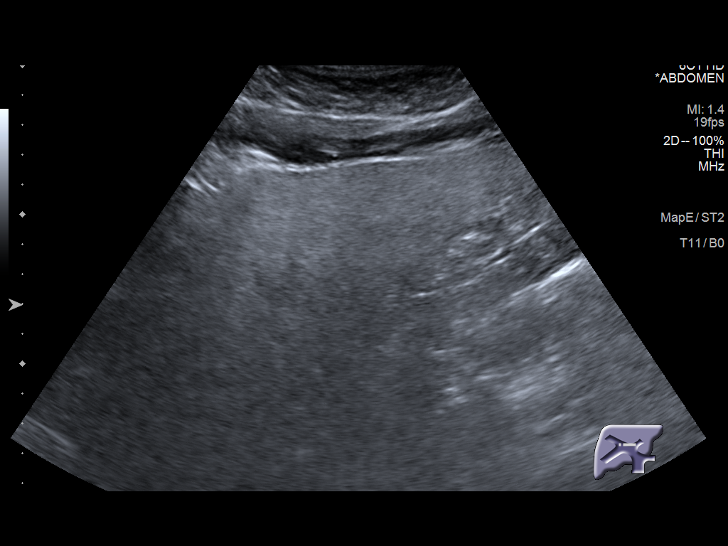
[im 68/82]
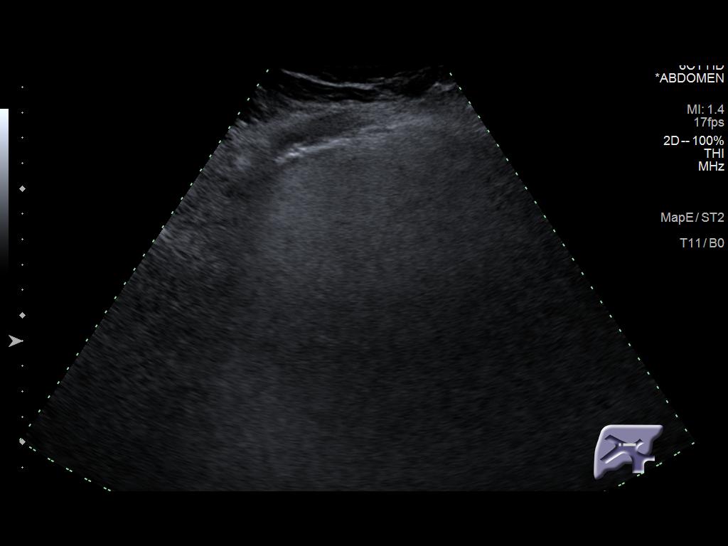
[im 75/82]
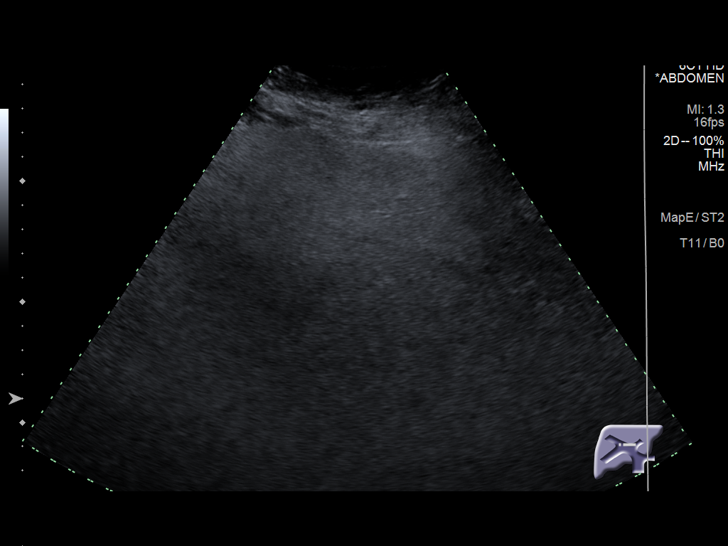
[im 82/82]
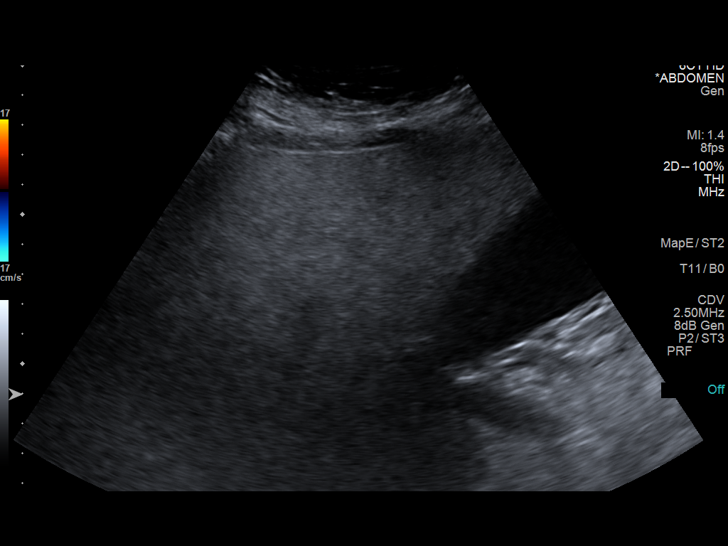

[13 of 25 positions shown; findings below may reference images not displayed]

FINDINGS: Gallbladder:

Gallbladder is distended. There are echogenic foci in the
gallbladder which move and shadow consistent with cholelithiasis.
Largest gallstone measures 2.4 cm in length. The gallbladder wall
does not appear appreciably thickened. The patient is focally tender
over this dilated gallbladder.

Common bile duct:

Diameter: 3 mm in visualized portion. Most of the common bile duct
is obscured by gas. No intrahepatic biliary duct dilatation is
appreciable.

Liver:

There is an apparent cyst in the left lobe of the liver measuring
1.2 x 1.0 x 0.8 cm. No other focal liver lesion evident. Liver
echogenicity overall is increased diffusely. Portal vein is patent
on color Doppler imaging with normal direction of blood flow towards
the liver.

Other: None.
IMPRESSION: 1. Distended gallbladder with cholelithiasis and focal tenderness
over the gallbladder. These findings are concerning for a degree of
acute cholecystitis.

2. Only slight visualization of the biliary ductal system due to
overlying gas.

3. Apparent 1.2 x 1.0 x 0.8 cm cyst in left lobe of liver. The liver
echogenicity is increased diffusely consistent with hepatic
steatosis and potential underlying parenchymal liver disease. Note
that there are no noncystic liver lesions appreciable on this study.
The sensitivity of ultrasound for detection of noncystic liver
lesions is diminished significantly given this degree of diffuse
increased liver echogenicity secondary to the hepatic steatosis.

These results will be called to the ordering clinician or
representative by the Radiologist Assistant, and communication
documented in the PACS or [REDACTED].

## 2022-06-07 DIAGNOSIS — Z0289 Encounter for other administrative examinations: Secondary | ICD-10-CM

## 2022-07-12 ENCOUNTER — Ambulatory Visit: Payer: BC Managed Care – PPO | Admitting: Bariatrics

## 2022-07-26 ENCOUNTER — Ambulatory Visit (INDEPENDENT_AMBULATORY_CARE_PROVIDER_SITE_OTHER): Payer: Self-pay | Admitting: Nurse Practitioner

## 2022-07-26 ENCOUNTER — Ambulatory Visit: Payer: Self-pay | Admitting: Nurse Practitioner
